# Patient Record
Sex: Female | Born: 1975 | Race: White | Hispanic: No | Marital: Single | Smoking: Never smoker
Health system: Southern US, Community
[De-identification: ages and names within clinical notes are randomized; demographics above are authoritative.]

## PROBLEM LIST (undated history)

## (undated) ENCOUNTER — Emergency Department (HOSPITAL_COMMUNITY): Payer: Self-pay | Source: Home / Self Care

## (undated) ENCOUNTER — Emergency Department: Admission: EM | Source: Home / Self Care

## (undated) DIAGNOSIS — R600 Localized edema: Secondary | ICD-10-CM

## (undated) DIAGNOSIS — F419 Anxiety disorder, unspecified: Secondary | ICD-10-CM

## (undated) DIAGNOSIS — F32A Depression, unspecified: Secondary | ICD-10-CM

## (undated) DIAGNOSIS — M2550 Pain in unspecified joint: Secondary | ICD-10-CM

## (undated) DIAGNOSIS — E039 Hypothyroidism, unspecified: Secondary | ICD-10-CM

## (undated) DIAGNOSIS — D649 Anemia, unspecified: Secondary | ICD-10-CM

## (undated) DIAGNOSIS — M549 Dorsalgia, unspecified: Secondary | ICD-10-CM

## (undated) DIAGNOSIS — R7303 Prediabetes: Secondary | ICD-10-CM

## (undated) DIAGNOSIS — G4730 Sleep apnea, unspecified: Secondary | ICD-10-CM

## (undated) DIAGNOSIS — F209 Schizophrenia, unspecified: Secondary | ICD-10-CM

## (undated) DIAGNOSIS — M722 Plantar fascial fibromatosis: Secondary | ICD-10-CM

## (undated) DIAGNOSIS — F99 Mental disorder, not otherwise specified: Secondary | ICD-10-CM

## (undated) DIAGNOSIS — K5900 Constipation, unspecified: Secondary | ICD-10-CM

## (undated) DIAGNOSIS — E7800 Pure hypercholesterolemia, unspecified: Secondary | ICD-10-CM

## (undated) DIAGNOSIS — E079 Disorder of thyroid, unspecified: Secondary | ICD-10-CM

## (undated) DIAGNOSIS — F909 Attention-deficit hyperactivity disorder, unspecified type: Secondary | ICD-10-CM

## (undated) DIAGNOSIS — I10 Essential (primary) hypertension: Secondary | ICD-10-CM

## (undated) DIAGNOSIS — J45909 Unspecified asthma, uncomplicated: Secondary | ICD-10-CM

## (undated) HISTORY — DX: Essential (primary) hypertension: I10

## (undated) HISTORY — DX: Anxiety disorder, unspecified: F41.9

## (undated) HISTORY — DX: Constipation, unspecified: K59.00

## (undated) HISTORY — DX: Pain in unspecified joint: M25.50

## (undated) HISTORY — DX: Unspecified asthma, uncomplicated: J45.909

## (undated) HISTORY — PX: OTHER SURGICAL HISTORY: SHX169

## (undated) HISTORY — DX: Localized edema: R60.0

## (undated) HISTORY — DX: Schizophrenia, unspecified: F20.9

## (undated) HISTORY — DX: Dorsalgia, unspecified: M54.9

## (undated) HISTORY — DX: Prediabetes: R73.03

## (undated) HISTORY — DX: Mental disorder, not otherwise specified: F99

## (undated) HISTORY — DX: Attention-deficit hyperactivity disorder, unspecified type: F90.9

## (undated) HISTORY — DX: Sleep apnea, unspecified: G47.30

## (undated) HISTORY — PX: ABDOMINAL HYSTERECTOMY: SHX81

## (undated) HISTORY — DX: Hypothyroidism, unspecified: E03.9

## (undated) HISTORY — DX: Plantar fascial fibromatosis: M72.2

## (undated) HISTORY — DX: Pure hypercholesterolemia, unspecified: E78.00

## (undated) HISTORY — DX: Anemia, unspecified: D64.9

## (undated) HISTORY — DX: Depression, unspecified: F32.A

---

## 1998-11-15 ENCOUNTER — Encounter: Payer: Self-pay | Admitting: Endocrinology

## 1998-11-15 ENCOUNTER — Emergency Department (HOSPITAL_COMMUNITY): Admission: EM | Admit: 1998-11-15 | Discharge: 1998-11-16 | Payer: Self-pay | Admitting: Endocrinology

## 1999-12-16 ENCOUNTER — Emergency Department (HOSPITAL_COMMUNITY): Admission: EM | Admit: 1999-12-16 | Discharge: 1999-12-16 | Payer: Self-pay | Admitting: Emergency Medicine

## 2002-03-09 ENCOUNTER — Emergency Department (HOSPITAL_COMMUNITY): Admission: EM | Admit: 2002-03-09 | Discharge: 2002-03-09 | Payer: Self-pay | Admitting: Emergency Medicine

## 2013-10-15 ENCOUNTER — Emergency Department (HOSPITAL_COMMUNITY)
Admission: EM | Admit: 2013-10-15 | Discharge: 2013-10-16 | Disposition: A | Discharge status: Home / Self Care | Attending: Emergency Medicine | Admitting: Emergency Medicine

## 2013-10-15 ENCOUNTER — Encounter (HOSPITAL_COMMUNITY): Payer: Self-pay | Admitting: Emergency Medicine

## 2013-10-15 DIAGNOSIS — Z862 Personal history of diseases of the blood and blood-forming organs and certain disorders involving the immune mechanism: Secondary | ICD-10-CM | POA: Insufficient documentation

## 2013-10-15 DIAGNOSIS — T7421XA Adult sexual abuse, confirmed, initial encounter: Secondary | ICD-10-CM | POA: Insufficient documentation

## 2013-10-15 DIAGNOSIS — S0993XA Unspecified injury of face, initial encounter: Secondary | ICD-10-CM | POA: Diagnosis not present

## 2013-10-15 DIAGNOSIS — S3994XA Unspecified injury of external genitals, initial encounter: Secondary | ICD-10-CM | POA: Diagnosis not present

## 2013-10-15 DIAGNOSIS — S39848A Other specified injuries of external genitals, initial encounter: Secondary | ICD-10-CM | POA: Diagnosis not present

## 2013-10-15 DIAGNOSIS — S199XXA Unspecified injury of neck, initial encounter: Secondary | ICD-10-CM | POA: Diagnosis not present

## 2013-10-15 DIAGNOSIS — Z8639 Personal history of other endocrine, nutritional and metabolic disease: Secondary | ICD-10-CM | POA: Insufficient documentation

## 2013-10-15 DIAGNOSIS — S4980XA Other specified injuries of shoulder and upper arm, unspecified arm, initial encounter: Secondary | ICD-10-CM | POA: Diagnosis not present

## 2013-10-15 DIAGNOSIS — S46909A Unspecified injury of unspecified muscle, fascia and tendon at shoulder and upper arm level, unspecified arm, initial encounter: Secondary | ICD-10-CM | POA: Insufficient documentation

## 2013-10-15 DIAGNOSIS — Z3202 Encounter for pregnancy test, result negative: Secondary | ICD-10-CM | POA: Insufficient documentation

## 2013-10-15 HISTORY — DX: Disorder of thyroid, unspecified: E07.9
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
## 2013-10-16 ENCOUNTER — Emergency Department (HOSPITAL_COMMUNITY)

## 2013-10-16 LAB — URINE MICROSCOPIC-ADD ON

## 2013-10-16 LAB — URINALYSIS, ROUTINE W REFLEX MICROSCOPIC
Bilirubin Urine: NEGATIVE
GLUCOSE, UA: NEGATIVE mg/dL
Hgb urine dipstick: NEGATIVE
Ketones, ur: NEGATIVE mg/dL
Nitrite: NEGATIVE
PH: 7.5 (ref 5.0–8.0)
Protein, ur: NEGATIVE mg/dL
SPECIFIC GRAVITY, URINE: 1.013 (ref 1.005–1.030)
Urobilinogen, UA: 0.2 mg/dL (ref 0.0–1.0)

## 2013-10-16 LAB — POC URINE PREG, ED: PREG TEST UR: NEGATIVE

## 2013-10-16 MED ORDER — PROMETHAZINE HCL 25 MG PO TABS
ORAL_TABLET | ORAL | Status: AC
  Administered 2013-10-16: 75 mg
  Filled 2013-10-16: qty 3

## 2013-10-16 MED ORDER — LEVONORGESTREL 0.75 MG PO TABS
ORAL_TABLET | ORAL | Status: AC
  Filled 2013-10-16: qty 2

## 2013-10-16 MED ORDER — AZITHROMYCIN 1 G PO PACK
PACK | ORAL | Status: AC
  Administered 2013-10-16: 1 g
  Filled 2013-10-16: qty 1

## 2013-10-16 MED ORDER — HYDROCODONE-ACETAMINOPHEN 5-325 MG PO TABS
1.0000 | ORAL_TABLET | Freq: Once | ORAL | Status: AC
  Administered 2013-10-16: 1 via ORAL
  Filled 2013-10-16: qty 1

## 2013-10-16 MED ORDER — CEFIXIME 400 MG PO TABS
ORAL_TABLET | ORAL | Status: AC
  Administered 2013-10-16: 400 mg
  Filled 2013-10-16: qty 1

## 2013-10-16 MED ORDER — METRONIDAZOLE 500 MG PO TABS
ORAL_TABLET | ORAL | Status: AC
  Administered 2013-10-16: 2000 mg
  Filled 2013-10-16: qty 4

## 2013-10-16 NOTE — ED Notes (Signed)
SANE RN here for evaluation

## 2013-10-16 NOTE — ED Provider Notes (Signed)
Medical screening examination/treatment/procedure(s) were performed by non-physician practitioner and as supervising physician I was immediately available for consultation/collaboration.   Sunnie NielsenBrian Victorya Hillman, MD 10/16/13 [phone]
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
## 2013-10-17 ENCOUNTER — Encounter (HOSPITAL_COMMUNITY): Payer: Self-pay | Admitting: Emergency Medicine

## 2013-10-17 ENCOUNTER — Emergency Department (HOSPITAL_COMMUNITY)
Admission: EM | Admit: 2013-10-17 | Discharge: 2013-10-17 | Disposition: A | Discharge status: Home / Self Care | Attending: Emergency Medicine | Admitting: Emergency Medicine

## 2013-10-17 DIAGNOSIS — T148XXA Other injury of unspecified body region, initial encounter: Secondary | ICD-10-CM

## 2013-10-17 DIAGNOSIS — Y93K1 Activity, walking an animal: Secondary | ICD-10-CM | POA: Insufficient documentation

## 2013-10-17 DIAGNOSIS — Z79899 Other long term (current) drug therapy: Secondary | ICD-10-CM | POA: Insufficient documentation

## 2013-10-17 DIAGNOSIS — W540XXA Bitten by dog, initial encounter: Secondary | ICD-10-CM | POA: Insufficient documentation

## 2013-10-17 DIAGNOSIS — Y929 Unspecified place or not applicable: Secondary | ICD-10-CM | POA: Insufficient documentation

## 2013-10-17 DIAGNOSIS — S01511A Laceration without foreign body of lip, initial encounter: Secondary | ICD-10-CM

## 2013-10-17 DIAGNOSIS — S01501A Unspecified open wound of lip, initial encounter: Secondary | ICD-10-CM | POA: Insufficient documentation

## 2013-10-17 DIAGNOSIS — E079 Disorder of thyroid, unspecified: Secondary | ICD-10-CM | POA: Insufficient documentation

## 2013-10-17 MED ORDER — AMOXICILLIN-POT CLAVULANATE 875-125 MG PO TABS
1.0000 | ORAL_TABLET | Freq: Once | ORAL | Status: AC
  Administered 2013-10-17: 1 via ORAL
  Filled 2013-10-17: qty 1

## 2013-10-17 MED ORDER — OXYCODONE-ACETAMINOPHEN 5-325 MG PO TABS
1.0000 | ORAL_TABLET | Freq: Once | ORAL | Status: AC
  Administered 2013-10-17: 1 via ORAL
  Filled 2013-10-17: qty 1

## 2013-10-17 MED ORDER — AMOXICILLIN-POT CLAVULANATE 875-125 MG PO TABS: 1.0000 | ORAL_TABLET | Freq: Two times a day (BID) | ORAL | Status: DC

## 2013-10-17 MED ORDER — HYDROCODONE-ACETAMINOPHEN 5-325 MG PO TABS: 1.0000 | ORAL_TABLET | ORAL | Status: DC | PRN
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
## 2013-10-17 NOTE — ED Notes (Signed)
Bed: WA20 Expected date:  Expected time:  Means of arrival:  Comments: ems-dogbite
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
## 2013-10-17 NOTE — ED Notes (Signed)
Dr. Patria Maneampos at bedside for lac repair.

## 2013-10-17 NOTE — Discharge Instructions (Signed)

## 2013-10-17 NOTE — ED Notes (Signed)
PER EMS - pt from home with c/o dog bite to upper lip and R hand, bleeding controlled.  A+Ox4.
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
## 2013-10-25 ENCOUNTER — Emergency Department (HOSPITAL_COMMUNITY)
Admission: EM | Admit: 2013-10-25 | Discharge: 2013-10-25 | Disposition: A | Discharge status: Home / Self Care | Attending: Emergency Medicine | Admitting: Emergency Medicine

## 2013-10-25 ENCOUNTER — Encounter (HOSPITAL_COMMUNITY): Payer: Self-pay | Admitting: Emergency Medicine

## 2013-10-25 DIAGNOSIS — Z23 Encounter for immunization: Secondary | ICD-10-CM | POA: Insufficient documentation

## 2013-10-25 DIAGNOSIS — Z792 Long term (current) use of antibiotics: Secondary | ICD-10-CM | POA: Insufficient documentation

## 2013-10-25 DIAGNOSIS — Z4802 Encounter for removal of sutures: Secondary | ICD-10-CM | POA: Insufficient documentation

## 2013-10-25 DIAGNOSIS — E079 Disorder of thyroid, unspecified: Secondary | ICD-10-CM | POA: Insufficient documentation

## 2013-10-25 DIAGNOSIS — Z5189 Encounter for other specified aftercare: Secondary | ICD-10-CM

## 2013-10-25 DIAGNOSIS — Z79899 Other long term (current) drug therapy: Secondary | ICD-10-CM | POA: Insufficient documentation

## 2013-10-25 DIAGNOSIS — Z4801 Encounter for change or removal of surgical wound dressing: Secondary | ICD-10-CM | POA: Insufficient documentation

## 2013-10-25 MED ORDER — TETANUS-DIPHTH-ACELL PERTUSSIS 5-2.5-18.5 LF-MCG/0.5 IM SUSP
0.5000 mL | Freq: Once | INTRAMUSCULAR | Status: AC
  Administered 2013-10-25: 0.5 mL via INTRAMUSCULAR
  Filled 2013-10-25: qty 0.5

## 2013-10-25 NOTE — ED Provider Notes (Signed)
Medical screening examination/treatment/procedure(s) were performed by non-physician practitioner and as supervising physician I was immediately available for consultation/collaboration.  Shanna CiscoMegan E Docherty, MD 10/25/13 2056

## 2013-10-25 NOTE — ED Notes (Signed)
Pt was treated for dog bite 5/6. Sutures intact.over r/and center lip. No drainage noted

## 2013-10-25 NOTE — ED Provider Notes (Signed)
CSN: [id number]     Arrival date & time 10/25/13  1238 History  This chart was scribed for non-physician practitioner, Trixie DredgeEmily Kirubel Aja  , working with Shanna CiscoMegan E Docherty, MD, by Tana ConchStephen Methvin ED Scribe. This patient was seen in [facility] and the patient's care was started at 1:31 PM.    Chief Complaint  Patient presents with  . Suture / Staple Removal    sutures to upper lip 8 days after dog bite      The history is provided by the patient and medical records.    HPI Comments: Erika Flores is a 38 y.o. female who presents to the Emergency Department for a f/u on a dog bite to her upper lip, she reports a current pain level of 2/10. She reports that it was her neighbor's vaccinated dog, she was walking the dog when it got startled and bit her. She denies any significant or increased pain, fevers, and discharge from her lip. Pt has only put some hydrogen peroxide on the wound once, nothing else per doctor instructions. Is taking Augmentin, has one does left.    Pt was seen on 10/17/13 for a dog bite to her upper lip, resulting in complex laceration .  Past Medical History  Diagnosis Date  . Thyroid disease    History reviewed. No pertinent past surgical history. No family history on file. History  Substance Use Topics  . Smoking status: Never Smoker   . Smokeless tobacco: Not on file  . Alcohol Use: Yes   OB History   Grav Para Term Preterm Abortions TAB SAB Ect Mult Living                 Review of Systems  Constitutional: Negative for fever and chills.  Skin: Positive for wound (upper lip).  Psychiatric/Behavioral: Negative for confusion and agitation.  All other systems reviewed and are negative.     Allergies  Review of patient's allergies indicates no known allergies.  Home Medications   Prior to Admission medications   Medication Sig Start Date End Date Taking? Authorizing Provider  amoxicillin-clavulanate (AUGMENTIN) 875-125 MG per tablet Take 1 tablet by mouth  every 12 (twelve) hours. 10/17/13   Lyanne CoKevin M Campos, MD  HYDROcodone-acetaminophen (NORCO/VICODIN) 5-325 MG per tablet Take 1 tablet by mouth every 4 (four) hours as needed for moderate pain. 10/17/13   Lyanne CoKevin M Campos, MD  levothyroxine (SYNTHROID, LEVOTHROID) 150 MCG tablet Take 150-300 mcg by mouth See admin instructions. 150 mcg mon-frid  300mcg on sat.sun    Historical Provider, MD   BP 127/88  Pulse 85  SpO2 99%  LMP 09/10/2013 Physical Exam  Nursing note and vitals reviewed. Constitutional: She is oriented to person, place, and time. She appears well-developed and well-nourished. No distress.  HENT:  Head: Normocephalic.    Neck: Normal range of motion. Neck supple.  Pulmonary/Chest: Effort normal.  Musculoskeletal: Normal range of motion.  Neurological: She is alert and oriented to person, place, and time.  Skin: She is not diaphoretic.    ED Course  Procedures (including critical care time)  DIAGNOSTIC STUDIES: Oxygen Saturation is 99% on RA, normal by my interpretation.    COORDINATION OF CARE:   1:37 PM-Discussed treatment plan which includes returning in several days with pt at bedside and pt agreed to plan.    Labs Review Labs Reviewed - No data to display  Imaging Review No results found.   EKG Interpretation None      1:34 PM- Suture  removal note.  Patient presents for suture removal. Only removed one on right lateral edge and left the others in.  Upon taking this suture out it appears the wound may dehisce if the other sutures and scabbing is removed.  Wound care and activity instructions given. Return in 3 days for recheck and possible suture removal.  MDM   Final diagnoses:  Encounter for wound re-check    Pt presents for suture removal and recheck of upper lip wound from dog bite 10/17/13.  Healing well.  Wound not sufficiently healed yet for full suture removal.  One suture removed from far corner of laceration. Appears that it might dehisce if  removed today.  Have asked pt to return in 3 days for recheck.  Discussed wound care at home.  No e/o infection.  D/C home.  Discussed findings, treatment, and follow up  with patient.  Pt given return precautions.  Pt verbalizes understanding and agrees with plan.       I personally performed the services described in this documentation, which was scribed in my presence. The recorded information has been reviewed and is accurate.     Trixie Dredgemily Leonie Amacher, PA-C 10/25/13 1358

## 2013-10-25 NOTE — Discharge Instructions (Signed)
Read the information below.  You may return to the Emergency Department at any time for worsening condition or any new symptoms that concern you.  If you develop redness, swelling, pus draining from the wound, or fevers greater than 100.4, return to the ER immediately for a recheck.     Wound Care Wound care helps prevent pain and infection.  You may need a tetanus shot if:  You cannot remember when you had your last tetanus shot.  You have never had a tetanus shot.  The injury broke your skin. If you need a tetanus shot and you choose not to have one, you may get tetanus. Sickness from tetanus can be serious. HOME CARE   Only take medicine as told by your doctor.  Clean the wound daily with mild soap and water.  Change any bandages (dressings) as told by your doctor.  Put medicated cream and a bandage on the wound as told by your doctor.  Change the bandage if it gets wet, dirty, or starts to smell.  Take showers. Do not take baths, swim, or do anything that puts your wound under water.  Rest and raise (elevate) the wound until the pain and puffiness (swelling) are better.  Keep all doctor visits as told. GET HELP RIGHT AWAY IF:   Yellowish-white fluid (pus) comes from the wound.  Medicine does not lessen your pain.  There is a red streak going away from the wound.  You have a fever. MAKE SURE YOU:   Understand these instructions.  Will watch your condition.  Will get help right away if you are not doing well or get worse. Document Released: 03/09/2008 Document Revised: 08/23/2011 Document Reviewed: 10/04/2010 [facility] Patient Information 2014 [place], [place].
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
## 2017-09-28 DIAGNOSIS — G8929 Other chronic pain: Secondary | ICD-10-CM | POA: Insufficient documentation

## 2017-09-28 DIAGNOSIS — M545 Low back pain, unspecified: Secondary | ICD-10-CM | POA: Insufficient documentation

## 2020-08-26 DIAGNOSIS — E039 Hypothyroidism, unspecified: Secondary | ICD-10-CM | POA: Insufficient documentation

## 2020-08-26 DIAGNOSIS — D509 Iron deficiency anemia, unspecified: Secondary | ICD-10-CM | POA: Insufficient documentation

## 2020-08-26 DIAGNOSIS — K649 Unspecified hemorrhoids: Secondary | ICD-10-CM | POA: Insufficient documentation

## 2020-08-26 DIAGNOSIS — F4329 Adjustment disorder with other symptoms: Secondary | ICD-10-CM | POA: Insufficient documentation

## 2020-08-26 DIAGNOSIS — F3181 Bipolar II disorder: Secondary | ICD-10-CM | POA: Insufficient documentation

## 2020-08-26 DIAGNOSIS — E032 Hypothyroidism due to medicaments and other exogenous substances: Secondary | ICD-10-CM | POA: Insufficient documentation

## 2020-08-26 DIAGNOSIS — T7411XA Adult physical abuse, confirmed, initial encounter: Secondary | ICD-10-CM | POA: Insufficient documentation

## 2020-08-26 DIAGNOSIS — R87612 Low grade squamous intraepithelial lesion on cytologic smear of cervix (LGSIL): Secondary | ICD-10-CM | POA: Insufficient documentation

## 2020-08-26 DIAGNOSIS — F39 Unspecified mood [affective] disorder: Secondary | ICD-10-CM | POA: Insufficient documentation

## 2020-08-26 DIAGNOSIS — N319 Neuromuscular dysfunction of bladder, unspecified: Secondary | ICD-10-CM | POA: Insufficient documentation

## 2020-08-26 DIAGNOSIS — F902 Attention-deficit hyperactivity disorder, combined type: Secondary | ICD-10-CM | POA: Insufficient documentation

## 2020-12-27 ENCOUNTER — Other Ambulatory Visit: Payer: Self-pay

## 2021-01-20 ENCOUNTER — Other Ambulatory Visit: Payer: Self-pay

## 2021-01-20 ENCOUNTER — Ambulatory Visit (INDEPENDENT_AMBULATORY_CARE_PROVIDER_SITE_OTHER): Payer: No Typology Code available for payment source | Admitting: Bariatrics

## 2021-01-20 ENCOUNTER — Encounter (INDEPENDENT_AMBULATORY_CARE_PROVIDER_SITE_OTHER): Payer: Self-pay | Admitting: Bariatrics

## 2021-01-20 VITALS — BP 123/84 | HR 80 | Temp 98.3°F | Ht 65.0 in | Wt 182.0 lb

## 2021-01-20 DIAGNOSIS — R0602 Shortness of breath: Secondary | ICD-10-CM

## 2021-01-20 DIAGNOSIS — R5383 Other fatigue: Secondary | ICD-10-CM | POA: Diagnosis not present

## 2021-01-20 DIAGNOSIS — Z9189 Other specified personal risk factors, not elsewhere classified: Secondary | ICD-10-CM

## 2021-01-20 DIAGNOSIS — Z1331 Encounter for screening for depression: Secondary | ICD-10-CM

## 2021-01-20 DIAGNOSIS — G4733 Obstructive sleep apnea (adult) (pediatric): Secondary | ICD-10-CM

## 2021-01-20 DIAGNOSIS — E538 Deficiency of other specified B group vitamins: Secondary | ICD-10-CM

## 2021-01-20 DIAGNOSIS — I10 Essential (primary) hypertension: Secondary | ICD-10-CM

## 2021-01-20 DIAGNOSIS — R7303 Prediabetes: Secondary | ICD-10-CM

## 2021-01-20 DIAGNOSIS — E669 Obesity, unspecified: Secondary | ICD-10-CM

## 2021-01-20 DIAGNOSIS — E038 Other specified hypothyroidism: Secondary | ICD-10-CM

## 2021-01-20 DIAGNOSIS — E6609 Other obesity due to excess calories: Secondary | ICD-10-CM

## 2021-01-20 DIAGNOSIS — E559 Vitamin D deficiency, unspecified: Secondary | ICD-10-CM

## 2021-01-20 DIAGNOSIS — Z6830 Body mass index (BMI) 30.0-30.9, adult: Secondary | ICD-10-CM

## 2021-01-20 NOTE — Patient Instructions (Signed)
Health Maintenance Due  Topic Date Due   Pneumococcal Vaccine 58-43 Years old (1 - PCV) Never done   HIV Screening  Never done   Hepatitis C Screening  Never done   PAP SMEAR-Modifier  Never done   INFLUENZA VACCINE  01/12/2021    Depression screen PHQ 2/9 01/20/2021  Decreased Interest 3  Down, Depressed, Hopeless 3  PHQ - 2 Score 6  Altered sleeping 2  Tired, decreased energy 2  Change in appetite 2  Feeling bad or failure about yourself  1  Trouble concentrating 1  Moving slowly or fidgety/restless 0  Suicidal thoughts 0  PHQ-9 Score 14  Difficult doing work/chores Somewhat difficult

## 2021-01-21 LAB — LIPID PANEL WITH LDL/HDL RATIO
Cholesterol, Total: 156 mg/dL (ref 100–199)
HDL: 54 mg/dL (ref >39)
LDL Chol Calc (NIH): 86 mg/dL (ref 0–99)
LDL/HDL Ratio: 1.6 ratio (ref 0.0–3.2)
Triglycerides: 82 mg/dL (ref 0–149)
VLDL Cholesterol Cal: 16 mg/dL (ref 5–40)

## 2021-01-21 LAB — VITAMIN D 25 HYDROXY (VIT D DEFICIENCY, FRACTURES): Vit D, 25-Hydroxy: 46.4 ng/mL (ref 30.0–100.0)

## 2021-01-21 LAB — T3, FREE: T3, Free: 2.5 pg/mL (ref 2.0–4.4)

## 2021-01-21 LAB — INSULIN, RANDOM: INSULIN: 20 u[IU]/mL (ref 2.6–24.9)

## 2021-01-21 LAB — TSH: TSH: 1.51 u[IU]/mL (ref 0.450–4.500)

## 2021-01-21 LAB — HEMOGLOBIN A1C
Est. average glucose Bld gHb Est-mCnc: 111 mg/dL
Hgb A1c MFr Bld: 5.5 % (ref 4.8–5.6)

## 2021-01-21 LAB — VITAMIN B12: Vitamin B-12: 504 pg/mL (ref 232–1245)

## 2021-01-21 NOTE — Progress Notes (Signed)
Dear Erika Flores, RD,   Thank you for referring Erika Flores to our clinic. Erika following note includes my evaluation and treatment recommendations.  Chief Complaint:   OBESITY Erika Flores (MR# [id number]) is a 45 y.o. female who presents for evaluation and treatment of obesity and related comorbidities. Current BMI is Body mass index is 30.29 kg/m. Erika Flores has been struggling with her weight for many years and has been unsuccessful in either losing weight, maintaining weight loss, or reaching her healthy weight goal.  Erika Flores is currently in Erika action stage of change and ready to dedicate time achieving and maintaining a healthier weight. Erika Flores is interested in becoming our patient and working on intensive lifestyle modifications including (but not limited to) diet and exercise for weight loss.  Erika Flores likes to Erika Flores.  She states that she is a "picky eater".  She sometimes skips meals.  Her goal is 150 pounds.  Erika Flores's habits were reviewed today and are as follows: Her family eats meals together, her desired weight loss is 80 pounds, she has been heavy most of her life, she started gaining weight in 2010, her heaviest weight ever was 215 pounds, she craves sweets, she wakes up frequently in Erika middle of Erika night to eat, she skips meals frequently, she frequently makes poor food choices, she frequently eats larger portions than normal, she has binge eating behaviors, and she struggles with emotional eating.  Depression Screen Erika Flores's Food and Mood (modified PHQ-9) score was 14.  Depression screen PHQ 2/9 01/20/2021  Decreased Interest 3  Down, Depressed, Hopeless 3  PHQ - 2 Score 6  Altered sleeping 2  Tired, decreased energy 2  Change in appetite 2  Feeling bad or failure about yourself  1  Trouble concentrating 1  Moving slowly or fidgety/restless 0  Suicidal thoughts 0  PHQ-9 Score 14  Difficult doing work/chores Somewhat difficult   Subjective:   1. Other fatigue Erika Flores admits  to daytime somnolence and reports waking up still tired. Erika Flores has a history of symptoms of daytime fatigue and morning fatigue. Erika Flores generally gets 4 hours of sleep per night, and states that she has poor quality sleep. Snoring is not present. Apneic episodes are present. Epworth Sleepiness Score is 6.  2. SOB (shortness of breath) on exertion Erika Flores notes increasing shortness of breath with exercising and seems to be worsening over time with weight gain. She notes getting out of breath sooner with activity than she used to. This has gotten worse recently. Erika Flores denies shortness of breath at rest or orthopnea.  3. Essential hypertension Review: taking medications as instructed, no medication side effects noted, no chest pain on exertion, no dyspnea on exertion, no swelling of ankles.  Controlled.  She is on no medications.  BP Readings from Last 3 Encounters:  01/20/21 123/84  10/25/13 127/88  10/17/13 132/85   4. OSA (obstructive sleep apnea) Erika Flores has a diagnosis of sleep apnea. She reports that she is not using a CPAP regularly.  She is claustrophobic.   5. Other specified hypothyroidism She is taking Synthroid.  Lab Results  Component Value Date   TSH 1.510 01/20/2021   6. Prediabetes Erika Flores has a diagnosis of prediabetes based on her elevated HgA1c and was informed this puts her at greater risk of developing diabetes. She continues to work on diet and exercise to decrease her risk of diabetes. She denies nausea or hypoglycemia.  Diagnosed 1 year ago.  Positive family history of diabetes.  Lab Results  Component Value Date   HGBA1C 5.5 01/20/2021   Lab Results  Component Value Date   INSULIN WILL FOLLOW 01/20/2021   7. Vitamin D deficiency She is currently taking no vitamin D supplement.   Lab Results  Component Value Date   VD25OH 46.4 01/20/2021   8. Vitamin B 12 deficiency She is not taking a vitamin B12 supplement.  Lab Results  Component Value Date   VITAMINB12 504  01/20/2021   9. Screening for depression Erika Flores was screened for depression as part of her new patient workup today.  PHQ-9 is 14.  10. At risk for activity intolerance Erika Flores is at risk for activity intolerance due to obesity and joint and back pain.  Assessment/Plan:   1. Other fatigue Erika Flores does feel that her weight is causing her energy to be lower than it should be. Fatigue may be related to obesity, depression or many other causes. Labs will be ordered, and in Erika meanwhile, Erika Flores will focus on self care including making healthy food choices, increasing physical activity and focusing on stress reduction.  - TSH - EKG 12-Lead - Comprehensive metabolic panel  2. SOB (shortness of breath) on exertion Erika Flores does feel that she gets out of breath more easily that she used to when she exercises. Erika Flores's shortness of breath appears to be obesity related and exercise induced. She has agreed to work on weight loss and gradually increase exercise to treat her exercise induced shortness of breath. Will continue to monitor closely.  3. Essential hypertension Erika Flores is working on healthy weight loss and exercise to improve blood pressure control. We will watch for signs of hypotension as she continues her lifestyle modifications.  No added salt in diet.  Increase exercise.  Check CMP and lipid panel.  - Lipid Panel With LDL/HDL Ratio  4. OSA (obstructive sleep apnea) Intensive lifestyle modifications are Erika first line treatment for this issue. We discussed several lifestyle modifications today and she will continue to work on diet, exercise and weight loss efforts. We will continue to monitor. Orders and follow up as documented in patient record.  We discussed Erika importance of restful sleep.  5. Other specified hypothyroidism Patient with long-standing hypothyroidism, on levothyroxine therapy. She appears euthyroid. Orders and follow up as documented in patient record.  Will check thyroid panel.   Continue Synthroid.  Counseling Good thyroid control is important for overall health. Supratherapeutic thyroid levels are dangerous and will not improve weight loss results. Erika correct way to take levothyroxine is fasting, with water, separated by at least 30 minutes from breakfast, and separated by more than 4 hours from calcium, iron, multivitamins, acid reflux medications (PPIs).   - T3, free - T4, free - T3, free  6. Prediabetes Lamaria will continue to work on weight loss, exercise, and decreasing simple carbohydrates to help decrease Erika risk of diabetes.  Check A1c and insulin level.  - Hemoglobin A1c - Insulin, random  7. Vitamin D deficiency Low Vitamin D level contributes to fatigue and are associated with obesity, breast, and colon cancer. Check vitamin D level today.  - VITAMIN D 25 Hydroxy (Vit-D Deficiency, Fractures)  8. Vitamin B 12 deficiency Erika diagnosis was reviewed with Erika patient. Counseling provided today, see below. We will continue to monitor. Orders and follow up as documented in patient record.  Check vitamin B12 level today.  Counseling Erika body needs vitamin B12: to make red blood cells; to make DNA; and to help Erika nerves work  properly so they can carry messages from Erika brain to Erika body.  Erika main causes of vitamin B12 deficiency include dietary deficiency, digestive diseases, pernicious anemia, and having a surgery in which part of Erika stomach or small intestine is removed.  Certain medicines can make it harder for Erika body to absorb vitamin B12. These medicines include: heartburn medications; some antibiotics; some medications used to treat diabetes, gout, and high cholesterol.  In some cases, there are no symptoms of this condition. If Erika condition leads to anemia or nerve damage, various symptoms can occur, such as weakness or fatigue, shortness of breath, and numbness or tingling in your hands and feet.   Treatment:  May include taking vitamin B12  supplements.  Avoid alcohol.  Eat lots of healthy foods that contain vitamin B12: Beef, pork, chicken, [place], and organ meats, such as liver.  Seafood: This includes clams, rainbow trout, salmon, tuna, and haddock. Eggs.  Cereal and dairy products that are fortified: This means that vitamin B12 has been added to Erika food.   - Vitamin B12  9. Screening for depression Erika Flores had a positive depression screening. Depression is commonly associated with obesity and often results in emotional eating behaviors. We will monitor this closely and work on CBT to help improve Erika non-hunger eating patterns. Referral to Psychology may be required if no improvement is seen as she continues in our clinic.  10. At risk for activity intolerance Erika Flores was given approximately 15 minutes of exercise intolerance counseling today. She is 45 y.o. female and has risk factors exercise intolerance including obesity. We discussed intensive lifestyle modifications today with an emphasis on specific weight loss instructions and strategies. Erika Flores will slowly increase activity as tolerated.  Repetitive spaced learning was employed today to elicit superior memory formation and behavioral change.   11. Class 1 obesity with serious comorbidity and body mass index (BMI) of 30.0 to 30.9 in adult, unspecified obesity type  Erika Flores is currently in Erika action stage of change and her goal is to continue with weight loss efforts. I recommend Erika Flores begin Erika structured treatment plan as follows:  She has agreed to Erika Category 2 Plan and keeping a food journal and adhering to recommended goals of 1200 calories and 80+ grams of protein.  She will work on meal planning, intention eating, will decrease sweets in her diet, and will decrease portion sizes.  Exercise goals:  Will start walking.    Behavioral modification strategies: increasing lean protein intake, decreasing simple carbohydrates, increasing vegetables, increasing water intake,  decreasing eating out, no skipping meals, meal planning and cooking strategies, keeping healthy foods in Erika home, and planning for success.  She was informed of Erika importance of frequent follow-up visits to maximize her success with intensive lifestyle modifications for her multiple health conditions. She was informed we would discuss her lab results at her next visit unless there is a critical issue that needs to be addressed sooner. Erika Flores agreed to keep her next visit at Erika agreed upon time to discuss these results.  Objective:   Blood pressure 123/84, pulse 80, temperature 98.3 F (36.8 C), height 5\' 5"  (1.651 m), weight 182 lb (82.6 kg), SpO2 99 %. Body mass index is 30.29 kg/m.  EKG: Normal sinus rhythm, rate 75 bpm.  Indirect Calorimeter completed today shows a VO2 of 248 and a REE of 1714.  Her calculated basal metabolic rate is [id number] thus her basal metabolic rate is better than expected.  General: Cooperative, alert, well  developed, in no acute distress. HEENT: Conjunctivae and lids unremarkable. Cardiovascular: Regular rhythm.  Lungs: Normal work of breathing. Neurologic: No focal deficits.   Lab Results  Component Value Date   HGBA1C 5.5 01/20/2021   Lab Results  Component Value Date   INSULIN WILL FOLLOW 01/20/2021   Lab Results  Component Value Date   TSH 1.510 01/20/2021   Lab Results  Component Value Date   CHOL 156 01/20/2021   HDL 54 01/20/2021   LDLCALC 86 01/20/2021   TRIG 82 01/20/2021   Attestation Statements:   Reviewed by clinician on day of visit: allergies, medications, problem list, medical history, surgical history, family history, social history, and previous encounter notes.  I, Insurance claims handler, CMA, am acting as Energy manager for [employer], DO  I have reviewed Erika above documentation for accuracy and completeness, and I agree with Erika above. Corinna Capra, DO

## 2021-01-22 ENCOUNTER — Encounter (INDEPENDENT_AMBULATORY_CARE_PROVIDER_SITE_OTHER): Payer: Self-pay | Admitting: Bariatrics

## 2021-01-22 DIAGNOSIS — R338 Other retention of urine: Secondary | ICD-10-CM | POA: Insufficient documentation

## 2021-01-22 DIAGNOSIS — K5904 Chronic idiopathic constipation: Secondary | ICD-10-CM | POA: Insufficient documentation

## 2021-01-22 DIAGNOSIS — J329 Chronic sinusitis, unspecified: Secondary | ICD-10-CM | POA: Insufficient documentation

## 2021-01-22 DIAGNOSIS — G4489 Other headache syndrome: Secondary | ICD-10-CM | POA: Insufficient documentation

## 2021-01-22 DIAGNOSIS — R945 Abnormal results of liver function studies: Secondary | ICD-10-CM | POA: Insufficient documentation

## 2021-01-22 DIAGNOSIS — R7989 Other specified abnormal findings of blood chemistry: Secondary | ICD-10-CM | POA: Insufficient documentation

## 2021-01-22 DIAGNOSIS — G4730 Sleep apnea, unspecified: Secondary | ICD-10-CM | POA: Insufficient documentation

## 2021-01-22 DIAGNOSIS — F5081 Binge eating disorder: Secondary | ICD-10-CM | POA: Insufficient documentation

## 2021-01-22 DIAGNOSIS — E890 Postprocedural hypothyroidism: Secondary | ICD-10-CM | POA: Insufficient documentation

## 2021-01-22 DIAGNOSIS — K219 Gastro-esophageal reflux disease without esophagitis: Secondary | ICD-10-CM | POA: Insufficient documentation

## 2021-01-22 DIAGNOSIS — H5210 Myopia, unspecified eye: Secondary | ICD-10-CM | POA: Insufficient documentation

## 2021-01-22 DIAGNOSIS — L508 Other urticaria: Secondary | ICD-10-CM | POA: Insufficient documentation

## 2021-02-03 ENCOUNTER — Other Ambulatory Visit: Payer: Self-pay

## 2021-02-03 ENCOUNTER — Ambulatory Visit (INDEPENDENT_AMBULATORY_CARE_PROVIDER_SITE_OTHER): Payer: No Typology Code available for payment source | Admitting: Bariatrics

## 2021-02-03 ENCOUNTER — Encounter (INDEPENDENT_AMBULATORY_CARE_PROVIDER_SITE_OTHER): Payer: Self-pay | Admitting: Bariatrics

## 2021-02-03 VITALS — BP 144/97 | HR 89 | Temp 98.3°F | Ht 65.0 in | Wt 180.0 lb

## 2021-02-03 DIAGNOSIS — R632 Polyphagia: Secondary | ICD-10-CM

## 2021-02-03 DIAGNOSIS — E038 Other specified hypothyroidism: Secondary | ICD-10-CM

## 2021-02-03 DIAGNOSIS — Z6830 Body mass index (BMI) 30.0-30.9, adult: Secondary | ICD-10-CM

## 2021-02-03 DIAGNOSIS — E8881 Metabolic syndrome: Secondary | ICD-10-CM

## 2021-02-03 DIAGNOSIS — R030 Elevated blood-pressure reading, without diagnosis of hypertension: Secondary | ICD-10-CM | POA: Diagnosis not present

## 2021-02-03 DIAGNOSIS — E669 Obesity, unspecified: Secondary | ICD-10-CM

## 2021-02-03 DIAGNOSIS — F5089 Other specified eating disorder: Secondary | ICD-10-CM

## 2021-02-03 MED ORDER — BUPROPION HCL ER (SR) 150 MG PO TB12: 150.0000 mg | ORAL_TABLET | Freq: Every day | ORAL | 0 refills | Status: DC

## 2021-02-04 NOTE — Progress Notes (Signed)
Chief Complaint:   OBESITY Erika Flores is here to discuss her progress with her obesity treatment plan along with follow-up of her obesity related diagnoses. Erika Flores is on the Category 2 Plan and states she is following her eating plan approximately 45% of the time. Erika Flores states she is doing 0 minutes 0 times per week.  Today's visit was #: 2 Starting weight: 182 lbs Starting date: 01/20/2021 Today's weight: 180 lbs Today's date: 02/03/2021 Total lbs lost to date: 2 lbs Total lbs lost since last in-office visit: 2 lbs  Interim History: Erika Flores is down 2 lbs since her last visit. She is doing a protein shake for breakfast. "I am a picky eater".  Subjective:   1. Insulin resistance Erika Flores is currently not on medications. Her insulin level = 200. Her A1C = 5.5.   2. Other specified hypothyroidism Erika Flores is currently taking Synthroid.  3. Elevated blood pressure reading Erika Flores is currently not on medication. She has no history of medicines. She notes pain in her leg. She had a scan at the ER no definite diagnosis.  4. Polyphagia Erika Flores has clingy sugars. She is eating more at night. She states she is hungry throughout the day.  5. Other disorder of eating Erika Flores notes emotional eating.  Assessment/Plan:   1. Insulin resistance Erika Flores will continue to work on weight loss, exercise, and decreasing simple carbohydrates to help decrease the risk of diabetes. She will increase healthy fats and protein. Erika Flores agreed to follow-up with [place] as directed to closely monitor her progress. Handout: on Insulin resistance was given today.  2. Other specified hypothyroidism Erika Flores will continue Synthroid. Orders and follow up as documented in patient record.  Counseling Good thyroid control is important for overall health. Erika Flores thyroid levels are dangerous and will not improve weight loss results. Counseling: The correct way to take levothyroxine is fasting, with water, separated by at least 30 minutes from  breakfast, and separated by more than 4 hours from calcium, iron, multivitamins, acid reflux medications (PPIs).    3. Elevated blood pressure reading Erika Flores will continue to monitor her blood pressure reading, and follow-up soon with her physician at the [place].   4. Polyphagia Intensive lifestyle modifications are the first line treatment for this issue. We discussed several lifestyle modifications today and she will continue to work on diet, exercise and weight loss efforts. Orders and follow up as documented in patient record.  Counseling Polyphagia is excessive hunger. Causes can include: low blood sugars, hypERthyroidism, PMS, lack of sleep, stress, insulin resistance, diabetes, certain medications, and diets that are deficient in protein and fiber.    5. Other disorder of eating Behavior modification techniques were discussed today to help Erika Flores deal with her emotional/non-hunger eating behaviors.  We will refill Wellbutrin 150 mg 1 tablet in the morning for1 month with no refills. Orders and follow up as documented in patient record.    - buPROPion (WELLBUTRIN SR) 150 MG 12 hr tablet; Take 1 tablet (150 mg total) by mouth daily.  Dispense: 30 tablet; Refill: 0  6. Obesity, current BMI 30 Erika Flores is currently in the action stage of change. As such, her goal is to continue with weight loss efforts. She has agreed to the Category 2 Plan.   Erika Flores will continue meal planning. We reviewed labs from 01/20/2021 today. She will weight meat and will get snacks in.  Exercise goals: No exercise has been prescribed at this time.  Behavioral modification strategies: increasing lean protein intake, decreasing  simple carbohydrates, increasing vegetables, increasing water intake, decreasing eating out, no skipping meals, meal planning and cooking strategies, keeping healthy foods in the home, and planning for success.  Erika Flores has agreed to follow-up with our clinic in 2 weeks. She was informed of the importance of  frequent follow-up visits to maximize her success with intensive lifestyle modifications for her multiple health conditions.   Objective:   Blood pressure (!) 144/97, pulse 89, temperature 98.3 F (36.8 C), height 5\' 5"  (1.651 m), weight 180 lb (81.6 kg), SpO2 99 %. Body mass index is 29.95 kg/m.  General: Cooperative, alert, well developed, in no acute distress. HEENT: Conjunctivae and lids unremarkable. Cardiovascular: Regular rhythm.  Lungs: Normal work of breathing. Neurologic: No focal deficits.   No results found for: CREATININE, BUN, NA, K, CL, CO2 No results found for: ALT, AST, GGT, ALKPHOS, BILITOT Lab Results  Component Value Date   HGBA1C 5.5 01/20/2021   Lab Results  Component Value Date   INSULIN 20.0 01/20/2021   Lab Results  Component Value Date   TSH 1.510 01/20/2021   Lab Results  Component Value Date   CHOL 156 01/20/2021   HDL 54 01/20/2021   LDLCALC 86 01/20/2021   TRIG 82 01/20/2021   Lab Results  Component Value Date   VD25OH 46.4 01/20/2021   No results found for: WBC, HGB, HCT, MCV, PLT No results found for: IRON, TIBC, FERRITIN  Attestation Statements:   Reviewed by clinician on day of visit: allergies, medications, problem list, medical history, surgical history, family history, social history, and previous encounter notes.  I, 03/22/2021, RMA, am acting as [place] for Energy manager, DO.   I have reviewed the above documentation for accuracy and completeness, and I agree with the above. [employer], DO

## 2021-02-05 ENCOUNTER — Encounter (INDEPENDENT_AMBULATORY_CARE_PROVIDER_SITE_OTHER): Payer: Self-pay | Admitting: Bariatrics

## 2021-02-17 ENCOUNTER — Telehealth (INDEPENDENT_AMBULATORY_CARE_PROVIDER_SITE_OTHER): Payer: Self-pay

## 2021-02-17 ENCOUNTER — Ambulatory Visit (INDEPENDENT_AMBULATORY_CARE_PROVIDER_SITE_OTHER): Payer: No Typology Code available for payment source | Admitting: Family Medicine

## 2021-02-17 ENCOUNTER — Other Ambulatory Visit: Payer: Self-pay

## 2021-02-17 ENCOUNTER — Encounter (INDEPENDENT_AMBULATORY_CARE_PROVIDER_SITE_OTHER): Payer: Self-pay | Admitting: Family Medicine

## 2021-02-17 DIAGNOSIS — E669 Obesity, unspecified: Secondary | ICD-10-CM

## 2021-02-17 DIAGNOSIS — R7303 Prediabetes: Secondary | ICD-10-CM | POA: Diagnosis not present

## 2021-02-17 DIAGNOSIS — F5089 Other specified eating disorder: Secondary | ICD-10-CM

## 2021-02-17 DIAGNOSIS — Z6830 Body mass index (BMI) 30.0-30.9, adult: Secondary | ICD-10-CM

## 2021-02-17 MED ORDER — BUPROPION HCL ER (SR) 150 MG PO TB12: 150.0000 mg | ORAL_TABLET | Freq: Every day | ORAL | 0 refills | Status: DC

## 2021-02-17 MED ORDER — TRULICITY 0.75 MG/0.5ML ~~LOC~~ SOAJ: 0.7500 mg | SUBCUTANEOUS | 0 refills | Status: DC

## 2021-02-17 NOTE — Telephone Encounter (Signed)
Prior authorizations has been started for Tr[employer]. Will notify patient and provider once response is received.

## 2021-02-17 NOTE — Progress Notes (Signed)
Chief Complaint:   OBESITY Jermaine is here to discuss her progress with her obesity treatment plan along with follow-up of her obesity related diagnoses. Harlee is on the Category 2 Plan and states she is following her eating plan approximately 50% of the time. Ajiah states she is walking for 60 minutes 2 times per week.  Today's visit was #: 3 Starting weight: 182 lbs Starting date: 01/20/2021 Today's weight: 184 lbs Today's date: 02/17/2021 Total lbs lost to date: 0 Total lbs lost since last in-office visit: 0  Interim History: Hiya is skipping breakfast. She is on plan at lunch but does not feel she gets full from frozen meal. She struggles with snacking between meals and at night. She on  plan at dinner also. She notes they tend to have snacks in the home that are not healthy.  Subjective:   1. Prediabetes Lilian's last A1C was 5.5. She notes hunger throughout the day.   Lab Results  Component Value Date   HGBA1C 5.5 01/20/2021   Lab Results  Component Value Date   INSULIN 20.0 01/20/2021   2. Other disorder of eating/emotional eating Traniyah sees a counselor at the [place] twice monthly and psychiatrist every 2 months. She is unsure if bupropion is helping with stress eating.  She denies side effects from bupropion. She notes emotional eating.  Assessment/Plan:   1. Prediabetes Sheriece agrees to start Trulicity 0.75 mg weekly. She will continue to work on weight loss, exercise, and decreasing simple carbohydrates to help decrease the risk of diabetes.   - Dulaglutide (TRULICITY) 0.75 MG/0.5ML SOPN; Inject 0.75 mg into the skin once a week.  Dispense: 2 mL; Refill: 0  2. Other disorder of eating/emotional eating Behavior modification techniques were discussed today to help Zeyna deal with her emotional/non-hunger eating behaviors.  We will refill bupropion 150 mg by mouth daily for 1 month with no refills. Orders and follow up as documented in patient record.    - buPROPion (WELLBUTRIN  SR) 150 MG 12 hr tablet; Take 1 tablet (150 mg total) by mouth daily.  Dispense: 30 tablet; Refill: 0  3. Obesity, current BMI 30.62 Sully is currently in the action stage of change. As such, her goal is to continue with weight loss efforts. She has agreed to the Category 2 Plan.   - buPROPion (WELLBUTRIN SR) 150 MG 12 hr tablet; Take 1 tablet (150 mg total) by mouth daily.  Dispense: 30 tablet; Refill: 0  Exercise goals:  As is.  Behavioral modification strategies: increasing lean protein intake, no skipping meals, and dealing with family or coworker sabotage.  Leslea has agreed to follow-up with our clinic in 2-3 weeks.  Objective:   Blood pressure 134/83, pulse 78, temperature 97.9 F (36.6 C), height 5\' 5"  (1.651 m), weight 184 lb (83.5 kg), SpO2 98 %. Body mass index is 30.62 kg/m.  General: Cooperative, alert, well developed, in no acute distress. HEENT: Conjunctivae and lids unremarkable. Cardiovascular: Regular rhythm.  Lungs: Normal work of breathing. Neurologic: No focal deficits.   No results found for: CREATININE, BUN, NA, K, CL, CO2 No results found for: ALT, AST, GGT, ALKPHOS, BILITOT Lab Results  Component Value Date   HGBA1C 5.5 01/20/2021   Lab Results  Component Value Date   INSULIN 20.0 01/20/2021   Lab Results  Component Value Date   TSH 1.510 01/20/2021   Lab Results  Component Value Date   CHOL 156 01/20/2021   HDL 54 01/20/2021  LDLCALC 86 01/20/2021   TRIG 82 01/20/2021   Lab Results  Component Value Date   VD25OH 46.4 01/20/2021   No results found for: WBC, HGB, HCT, MCV, PLT No results found for: IRON, TIBC, FERRITIN  Obesity Behavioral Intervention:   Approximately 15 minutes were spent on the discussion below.  ASK: We discussed the diagnosis of obesity with Misty Stanley today and Christopher agreed to give [place] permission to discuss obesity behavioral modification therapy today.  ASSESS: [place] has the diagnosis of obesity and her BMI today is  30.7. Kateleen is in the action stage of change.   ADVISE: Falen was educated on the multiple health risks of obesity as well as the benefit of weight loss to improve her health. She was advised of the need for long term treatment and the importance of lifestyle modifications to improve her current health and to decrease her risk of future health problems.  AGREE: Multiple dietary modification options and treatment options were discussed and Shadonna agreed to follow the recommendations documented in the above note.  ARRANGE: Mauriana was educated on the importance of frequent visits to treat obesity as outlined per CMS and USPSTF guidelines and agreed to schedule her next follow up appointment today.  Attestation Statements:   Reviewed by clinician on day of visit: allergies, medications, problem list, medical history, surgical history, family history, social history, and previous encounter notes.   I, Jackson Latino, RMA, am acting as Energy manager for [place], FNP.   I have reviewed the above documentation for accuracy and completeness, and I agree with the above. -  Jesse Sans, FNP

## 2021-02-17 NOTE — Telephone Encounter (Signed)
Pt had a in office visit today.  Dawn has rx'd Trulicity today, as she thinks pt may need a PA for this.  Please advise.

## 2021-02-19 DIAGNOSIS — E669 Obesity, unspecified: Secondary | ICD-10-CM | POA: Insufficient documentation

## 2021-02-19 DIAGNOSIS — Z6830 Body mass index (BMI) 30.0-30.9, adult: Secondary | ICD-10-CM | POA: Insufficient documentation

## 2021-02-19 DIAGNOSIS — R7303 Prediabetes: Secondary | ICD-10-CM | POA: Insufficient documentation

## 2021-02-23 ENCOUNTER — Telehealth (INDEPENDENT_AMBULATORY_CARE_PROVIDER_SITE_OTHER): Payer: Self-pay | Admitting: Family Medicine

## 2021-02-23 ENCOUNTER — Encounter (INDEPENDENT_AMBULATORY_CARE_PROVIDER_SITE_OTHER): Payer: Self-pay | Admitting: Family Medicine

## 2021-02-23 DIAGNOSIS — R7303 Prediabetes: Secondary | ICD-10-CM

## 2021-02-23 NOTE — Telephone Encounter (Addendum)
Prior authorization was sent on 02/17/21 through [facility] for Trulicity. Patient stated in message the doctor sent a form to be completed. I have not seen or received a form.

## 2021-02-23 NOTE — Telephone Encounter (Signed)
Pt last seen by Dawn Whitmire, FNP.  

## 2021-02-24 ENCOUNTER — Other Ambulatory Visit (INDEPENDENT_AMBULATORY_CARE_PROVIDER_SITE_OTHER): Payer: Self-pay | Admitting: Family Medicine

## 2021-02-24 DIAGNOSIS — R7303 Prediabetes: Secondary | ICD-10-CM

## 2021-02-24 MED ORDER — OZEMPIC (0.25 OR 0.5 MG/DOSE) 2 MG/1.5ML ~~LOC~~ SOPN: 0.2500 mg | PEN_INJECTOR | SUBCUTANEOUS | 0 refills | Status: DC

## 2021-02-24 NOTE — Telephone Encounter (Signed)
There is a new message thread started from the patient.  Please see other message.

## 2021-02-24 NOTE — Telephone Encounter (Signed)
Fax from [place]:  Trulicity not covered Alternatives included, per the pharmacy:  Ozempic Please change to [employer]

## 2021-03-10 ENCOUNTER — Encounter (INDEPENDENT_AMBULATORY_CARE_PROVIDER_SITE_OTHER): Payer: Self-pay | Admitting: Family Medicine

## 2021-03-10 ENCOUNTER — Ambulatory Visit (INDEPENDENT_AMBULATORY_CARE_PROVIDER_SITE_OTHER): Payer: No Typology Code available for payment source | Admitting: Family Medicine

## 2021-03-10 ENCOUNTER — Other Ambulatory Visit: Payer: Self-pay

## 2021-03-10 VITALS — BP 124/90 | HR 89 | Temp 97.9°F | Ht 65.0 in | Wt 184.0 lb

## 2021-03-10 DIAGNOSIS — E669 Obesity, unspecified: Secondary | ICD-10-CM | POA: Diagnosis not present

## 2021-03-10 DIAGNOSIS — F5081 Binge eating disorder: Secondary | ICD-10-CM

## 2021-03-10 DIAGNOSIS — R7303 Prediabetes: Secondary | ICD-10-CM

## 2021-03-10 DIAGNOSIS — F5089 Other specified eating disorder: Secondary | ICD-10-CM | POA: Diagnosis not present

## 2021-03-10 DIAGNOSIS — Z6830 Body mass index (BMI) 30.0-30.9, adult: Secondary | ICD-10-CM

## 2021-03-10 MED ORDER — BUPROPION HCL ER (SR) 150 MG PO TB12: 150.0000 mg | ORAL_TABLET | Freq: Every day | ORAL | 0 refills | Status: DC

## 2021-03-10 MED ORDER — OZEMPIC (0.25 OR 0.5 MG/DOSE) 2 MG/1.5ML ~~LOC~~ SOPN: 0.2500 mg | PEN_INJECTOR | SUBCUTANEOUS | 0 refills | Status: DC

## 2021-03-10 NOTE — Progress Notes (Signed)
Chief Complaint:   OBESITY Annalaura is here to discuss her progress with her obesity treatment plan along with follow-up of her obesity related diagnoses. Sheela is on the Category 2 Plan and states she is following her eating plan approximately 60% of the time. Tanishi states she is walking for 45 minutes 3 times per week.  Today's visit was #: 4 Starting weight: 182 lbs Starting date: 01/20/2021 Today's weight: 184 lbs Today's date: 03/10/2021 Total lbs lost to date: 0  Total lbs lost since last in-office visit: maintained  Interim History: Kynesha feels she is doing better with adhering to the plan. She has a protein bar or shake for breakfast. She has a frozen meal at lunch. She has vegetables and meat with supper. She has peanut butter for a snack.   Subjective:   1. Prediabetes Keyira has her first Ozempic shot today. She struggles with polyphagia throughout the day and sometimes gets up at night to eat.   Lab Results  Component Value Date   HGBA1C 5.5 01/20/2021   Lab Results  Component Value Date   INSULIN 20.0 01/20/2021    2. Binge eating disorder Lias struggles with this 2-3 days per week. This is triggered by stress. She talks with counselor at [facility] about this and sees him monthly. She is on Vyvanse. She has been binge eating for several years.   3. Other disorder of eating/emotional eating Natacia was started on Bupropion at last office visit. She felt it helped with emotional eating at first but now she is unsure.  Assessment/Plan:   1. Prediabetes  Carliss may increase Ozempic to 0.5 mg if needed.  - Semaglutide,0.25 or 0.5MG /DOS, (OZEMPIC, 0.25 OR 0.5 MG/DOSE,) 2 MG/1.5ML SOPN; Inject 0.25 mg into the skin once a week.  Dispense: 1.5 mL; Refill: 0  2. Binge eating disorder Aleysha will continue Vyvanse and continue to follow up with counselor at [facility].  3. Other disorder of eating/emotional eating   We will refill bupropion 150 mg for 1 month with no refills. Orders and follow  up as documented in patient record.    - buPROPion (WELLBUTRIN SR) 150 MG 12 hr tablet; Take 1 tablet (150 mg total) by mouth daily.  Dispense: 30 tablet; Refill: 0  4. Obesity, current BMI 30.62 Levy is currently in the action stage of change. As such, her goal is to continue with weight loss efforts. She has agreed to the Category 2 Plan.   Illene was advised she may have a side salad at lunch with 10 calorie dressing rather than a piece of fruit.Marland Kitchen  Exercise goals:  As is.  Behavioral modification strategies: decreasing simple carbohydrates, decreasing liquid calories, and dealing with family or coworker sabotage.  Madgie has agreed to follow-up with our clinic in 2-3 weeks.  Objective:   Blood pressure 124/90, pulse 89, temperature 97.9 F (36.6 C), height 5\' 5"  (1.651 m), weight 184 lb (83.5 kg), SpO2 98 %. Body mass index is 30.62 kg/m.  General: Cooperative, alert, well developed, in no acute distress. HEENT: Conjunctivae and lids unremarkable. Cardiovascular: Regular rhythm.  Lungs: Normal work of breathing. Neurologic: No focal deficits.   No results found for: CREATININE, BUN, NA, K, CL, CO2 No results found for: ALT, AST, GGT, ALKPHOS, BILITOT Lab Results  Component Value Date   HGBA1C 5.5 01/20/2021   Lab Results  Component Value Date   INSULIN 20.0 01/20/2021   Lab Results  Component Value Date   TSH 1.510 01/20/2021  Lab Results  Component Value Date   CHOL 156 01/20/2021   HDL 54 01/20/2021   LDLCALC 86 01/20/2021   TRIG 82 01/20/2021   Lab Results  Component Value Date   VD25OH 46.4 01/20/2021   No results found for: WBC, HGB, HCT, MCV, PLT No results found for: IRON, TIBC, FERRITIN  Attestation Statements:   Reviewed by clinician on day of visit: allergies, medications, problem list, medical history, surgical history, family history, social history, and previous encounter notes.   I, Jackson Latino, RMA, am acting as Energy manager for [employer], FNP.   I have reviewed the above documentation for accuracy and completeness, and I agree with the above. -  Jesse Sans, FNP

## 2021-03-25 ENCOUNTER — Ambulatory Visit (INDEPENDENT_AMBULATORY_CARE_PROVIDER_SITE_OTHER): Payer: No Typology Code available for payment source | Admitting: Family Medicine

## 2021-03-25 ENCOUNTER — Other Ambulatory Visit: Payer: Self-pay

## 2021-03-25 ENCOUNTER — Encounter (INDEPENDENT_AMBULATORY_CARE_PROVIDER_SITE_OTHER): Payer: Self-pay | Admitting: Family Medicine

## 2021-03-25 VITALS — BP 131/91 | HR 94 | Temp 98.9°F | Ht 65.0 in | Wt 183.0 lb

## 2021-03-25 DIAGNOSIS — E669 Obesity, unspecified: Secondary | ICD-10-CM

## 2021-03-25 DIAGNOSIS — R7303 Prediabetes: Secondary | ICD-10-CM

## 2021-03-25 DIAGNOSIS — Z6830 Body mass index (BMI) 30.0-30.9, adult: Secondary | ICD-10-CM | POA: Diagnosis not present

## 2021-03-25 DIAGNOSIS — F5089 Other specified eating disorder: Secondary | ICD-10-CM

## 2021-03-25 MED ORDER — OZEMPIC (0.25 OR 0.5 MG/DOSE) 2 MG/1.5ML ~~LOC~~ SOPN: 0.2500 mg | PEN_INJECTOR | SUBCUTANEOUS | 0 refills | Status: DC

## 2021-03-25 MED ORDER — BUPROPION HCL ER (SR) 150 MG PO TB12: 150.0000 mg | ORAL_TABLET | Freq: Every day | ORAL | 0 refills | Status: DC

## 2021-03-25 NOTE — Progress Notes (Signed)
Chief Complaint:   OBESITY Erika Flores is here to discuss her progress with her obesity treatment plan along with follow-up of her obesity related diagnoses. Erika Flores is on the Category 2 Plan and states she is following her eating plan approximately 65% of the time. Erika Flores states she is walking and weight training for 60 minutes 4-5 times per week.  Today's visit was #: 5 Starting weight: 182 lbs Starting date: 01/20/2021 Today's weight: 183 lbs Today's date: 03/25/2021 Total lbs lost to date: 0 Total lbs lost since last in-office visit: 1 lb  Interim History: Erika Flores feels she is eating more protein. She sometimes gets up and eats peanut butter during the night. She has started going to the gym with her boyfriend and has gained some muscle.  Subjective:   1. Prediabetes Erika Flores is on 0.5 mg weekly. She did not feel the 0.25 mg dose helped with appetite so she increased dose on her own.  Lab Results  Component Value Date   HGBA1C 5.5 01/20/2021   Lab Results  Component Value Date   INSULIN 20.0 01/20/2021    2. Other disorder of eating/emotional eating Erika Flores feels cravings are somewhat better on bupropion.   Assessment/Plan:   1. Prediabetes Erika Flores will continue to work on weight loss, exercise, and decreasing simple carbohydrates to help decrease the risk of diabetes. We will refill Ozempic 0.5 mg weekly.  - Semaglutide,0.25 or 0.5MG /DOS, (OZEMPIC, 0.25 OR 0.5 MG/DOSE,) 2 MG/1.5ML SOPN; Inject 0.25 mg into the skin once a week.  Dispense: 1.5 mL; Refill: 0  2. Other disorder of eating/emotional eating  We will refill bupropion 150 mg daily. Orders and follow up as documented in patient record.    - buPROPion (WELLBUTRIN SR) 150 MG 12 hr tablet; Take 1 tablet (150 mg total) by mouth daily.  Dispense: 30 tablet; Refill: 0  3. Obesity, current BMI 30.45 Erika Flores is currently in the action stage of change. As such, her goal is to continue with weight loss efforts. She has agreed to the  Category 2 Plan.   Erika Flores will limit snacks to 100 calories snacks during the night.  Exercise goals:  As is.  Behavioral modification strategies: increasing lean protein intake and better snacking choices.  Erika Flores has agreed to follow-up with our clinic in 2-3 weeks.  Objective:   Blood pressure (!) 131/91, pulse 94, temperature 98.9 F (37.2 C), height 5\' 5"  (1.651 m), weight 183 lb (83 kg), SpO2 97 %. Body mass index is 30.45 kg/m.  General: Cooperative, alert, well developed, in no acute distress. HEENT: Conjunctivae and lids unremarkable. Cardiovascular: Regular rhythm.  Lungs: Normal work of breathing. Neurologic: No focal deficits.   No results found for: CREATININE, BUN, NA, K, CL, CO2 No results found for: ALT, AST, GGT, ALKPHOS, BILITOT Lab Results  Component Value Date   HGBA1C 5.5 01/20/2021   Lab Results  Component Value Date   INSULIN 20.0 01/20/2021   Lab Results  Component Value Date   TSH 1.510 01/20/2021   Lab Results  Component Value Date   CHOL 156 01/20/2021   HDL 54 01/20/2021   LDLCALC 86 01/20/2021   TRIG 82 01/20/2021   Lab Results  Component Value Date   VD25OH 46.4 01/20/2021   No results found for: WBC, HGB, HCT, MCV, PLT No results found for: IRON, TIBC, FERRITIN  Attestation Statements:   Reviewed by clinician on day of visit: allergies, medications, problem list, medical history, surgical history, family history, social history,  and previous encounter notes.  I, Jackson Latino, RMA, am acting as Energy manager for [place], FNP.   I have reviewed the above documentation for accuracy and completeness, and I agree with the above. -  Jesse Sans, FNP

## 2021-04-14 ENCOUNTER — Encounter (INDEPENDENT_AMBULATORY_CARE_PROVIDER_SITE_OTHER): Payer: Self-pay | Admitting: Family Medicine

## 2021-04-14 ENCOUNTER — Ambulatory Visit (INDEPENDENT_AMBULATORY_CARE_PROVIDER_SITE_OTHER): Payer: No Typology Code available for payment source | Admitting: Family Medicine

## 2021-04-14 ENCOUNTER — Other Ambulatory Visit: Payer: Self-pay

## 2021-04-14 VITALS — BP 127/87 | HR 89 | Temp 97.8°F | Ht 65.0 in | Wt 183.0 lb

## 2021-04-14 DIAGNOSIS — Z6830 Body mass index (BMI) 30.0-30.9, adult: Secondary | ICD-10-CM

## 2021-04-14 DIAGNOSIS — E669 Obesity, unspecified: Secondary | ICD-10-CM

## 2021-04-14 DIAGNOSIS — F5089 Other specified eating disorder: Secondary | ICD-10-CM

## 2021-04-14 DIAGNOSIS — R7303 Prediabetes: Secondary | ICD-10-CM

## 2021-04-14 MED ORDER — BUPROPION HCL ER (SR) 150 MG PO TB12: 150.0000 mg | ORAL_TABLET | Freq: Every day | ORAL | 0 refills | Status: DC

## 2021-04-14 MED ORDER — OZEMPIC (0.25 OR 0.5 MG/DOSE) 2 MG/1.5ML ~~LOC~~ SOPN: 0.5000 mg | PEN_INJECTOR | SUBCUTANEOUS | 0 refills | Status: DC

## 2021-04-14 NOTE — Progress Notes (Signed)
Chief Complaint:   OBESITY Erika Flores is here to discuss her progress with her obesity treatment plan along with follow-up of her obesity related diagnoses. Erika Flores is on the Category 2 Plan and states she is following her eating plan approximately 40% of the time. Erika Flores states she is walking for 45-60 minutes 3 times per week.  Today's visit was #: 6 Starting weight: 182 lbs Starting date: 01/20/2021 Today's weight: 183 lbs Today's date: 04/14/2021 Total lbs lost to date: 0 Total lbs lost since last in-office visit: 0  Interim History: Erika Flores went on vacation and did not gain any weight. She was off plan with her eating. However, she feels the Ozempic helped moderate her portions. She is up some muscle mass and down fat percentage. She has been working out wit her boyfriend.  Subjective:   1. Prediabetes Erika Flores is on Ozempic 0.5 mg weekly. She notes mild nausea and constipation.   Lab Results  Component Value Date   HGBA1C 5.5 01/20/2021   Lab Results  Component Value Date   INSULIN 20.0 01/20/2021    2. Other disorder of eating/emotional eating Erika Flores notes mood is stable and craving are fairly well controlled. She does still get up and eat during the night about 2 times per week-usually peanut butter..  Assessment/Plan:   1. Prediabetes We will refill Ozempic 0.5 mg weekly. Erika Flores will continue to work on weight loss, exercise, and decreasing simple carbohydrates to help decrease the risk of diabetes.   - Semaglutide,0.25 or 0.5MG /DOS, (OZEMPIC, 0.25 OR 0.5 MG/DOSE,) 2 MG/1.5ML SOPN; Inject 0.5 mg into the skin once a week.  Dispense: 1.5 mL; Refill: 0  2. Other disorder of eating/emotional eating  We will refill bupropion 150 mg daily with 3 months supply with no refills. She will limit snacks calories at night to < than 100.Orders and follow up as documented in patient record.    - buPROPion (WELLBUTRIN SR) 150 MG 12 hr tablet; Take 1 tablet (150 mg total) by mouth daily.  Dispense:  90 tablet; Refill: 0  3. Obesity, current BMI 30.45 Erika Flores is currently in the action stage of change. As such, her goal is to continue with weight loss efforts. She has agreed to the Category 2 Plan.   Handout: Holiday strategies was given to Erika Flores today.   Exercise goals:  As is.  Behavioral modification strategies: increasing lean protein intake and decreasing simple carbohydrates.  Erika Flores has agreed to follow-up with our clinic. She cannot make a follow up right now due to constraints of [facility] coverage.   Objective:   Blood pressure 127/87, pulse 89, temperature 97.8 F (36.6 C), height 5\' 5"  (1.651 m), weight 183 lb (83 kg), SpO2 99 %. Body mass index is 30.45 kg/m.  General: Cooperative, alert, well developed, in no acute distress. HEENT: Conjunctivae and lids unremarkable. Cardiovascular: Regular rhythm.  Lungs: Normal work of breathing. Neurologic: No focal deficits.   No results found for: CREATININE, BUN, NA, K, CL, CO2 No results found for: ALT, AST, GGT, ALKPHOS, BILITOT Lab Results  Component Value Date   HGBA1C 5.5 01/20/2021   Lab Results  Component Value Date   INSULIN 20.0 01/20/2021   Lab Results  Component Value Date   TSH 1.510 01/20/2021   Lab Results  Component Value Date   CHOL 156 01/20/2021   HDL 54 01/20/2021   LDLCALC 86 01/20/2021   TRIG 82 01/20/2021   Lab Results  Component Value Date   VD25OH 46.4  01/20/2021   No results found for: WBC, HGB, HCT, MCV, PLT No results found for: IRON, TIBC, FERRITIN  Attestation Statements:   Reviewed by clinician on day of visit: allergies, medications, problem list, medical history, surgical history, family history, social history, and previous encounter notes.  I, Jackson Latino, RMA, am acting as Energy manager for [place], FNP.   I have reviewed the above documentation for accuracy and completeness, and I agree with the above. -  Jesse Sans, FNP

## 2022-01-20 ENCOUNTER — Encounter (INDEPENDENT_AMBULATORY_CARE_PROVIDER_SITE_OTHER): Payer: Self-pay

## 2022-09-13 ENCOUNTER — Ambulatory Visit (INDEPENDENT_AMBULATORY_CARE_PROVIDER_SITE_OTHER): Payer: No Typology Code available for payment source | Admitting: Bariatrics

## 2022-09-13 ENCOUNTER — Encounter: Payer: Self-pay | Admitting: Bariatrics

## 2022-09-13 VITALS — BP 130/85 | HR 68 | Temp 97.9°F | Ht 64.0 in | Wt 180.0 lb

## 2022-09-13 DIAGNOSIS — E038 Other specified hypothyroidism: Secondary | ICD-10-CM | POA: Diagnosis not present

## 2022-09-13 DIAGNOSIS — R7303 Prediabetes: Secondary | ICD-10-CM | POA: Diagnosis not present

## 2022-09-13 DIAGNOSIS — E669 Obesity, unspecified: Secondary | ICD-10-CM | POA: Diagnosis not present

## 2022-09-13 DIAGNOSIS — Z6830 Body mass index (BMI) 30.0-30.9, adult: Secondary | ICD-10-CM

## 2022-09-13 DIAGNOSIS — Z0289 Encounter for other administrative examinations: Secondary | ICD-10-CM

## 2022-09-13 DIAGNOSIS — F5081 Binge eating disorder: Secondary | ICD-10-CM

## 2022-09-13 NOTE — Progress Notes (Signed)
Office: [phone]  /  Fax: [phone]   Initial Visit  Erika Flores was seen in clinic today to evaluate for obesity. She is interested in losing weight to improve overall health and reduce the risk of weight related complications. She presents today to review program treatment options, initial physical assessment, and evaluation.     She was referred by: PCP  When asked what else they would like to accomplish? She states: Adopt healthier eating patterns  When asked how has your weight affected you? She states: Having fatigue and Problems with eating patterns  Some associated conditions: Prediabetes and Other: Hypothyroidism   Contributing factors: Family history and Eating patterns  Weight promoting medications identified: None  Current nutrition plan: Low-carb, High-protein, and Other: Paleo -like  Current level of physical activity: None and Walking  Current or previous pharmacotherapy: GLP-1  Response to medication: Lost weight and was able to maintain weight loss   Past medical history includes:   Past Medical History:  Diagnosis Date   ADHD    Anemia, unspecified    Anxiety    Asthma    Back pain    Constipation    Depression    Edema of both lower extremities    Hypercholesterolemia    Hypertension    Hypothyroidism    Joint pain    Mental health disorder    Plantar fasciitis    Prediabetes    Schizophrenia    Sleep apnea    Thyroid disease      Objective:   BP 130/85   Pulse 68   Temp 97.9 F (36.6 C)   Ht 5\' 4"  (1.626 m)   Wt 180 lb (81.6 kg)   SpO2 100%   BMI 30.90 kg/m  She was weighed on the bioimpedance scale: Body mass index is 30.9 kg/m.  Peak Weight:180 lbs , Body Fat%:31.3, Visceral Fat Rating:7, Weight trend over the last 12 months: fluctuating  General:  Alert, oriented and cooperative. Patient is in no acute distress.  Respiratory: Normal respiratory effort, no problems with respiration noted  Extremities: Normal range  of motion.    Mental Status: Normal mood and affect. Normal behavior. Normal judgment and thought content.   DIAGNOSTIC DATA REVIEWED:  BMET No results found for: "NA", "K", "CL", "CO2", "GLUCOSE", "BUN", "CREATININE", "CALCIUM", "GFRNONAA", "GFRAA" Lab Results  Component Value Date   HGBA1C 5.5 01/20/2021   Lab Results  Component Value Date   INSULIN 20.0 01/20/2021   CBC No results found for: "WBC", "RBC", "HGB", "HCT", "PLT", "MCV", "MCH", "MCHC", "RDW" Iron/TIBC/Ferritin/ %Sat No results found for: "IRON", "TIBC", "FERRITIN", "IRONPCTSAT" Lipid Panel     Component Value Date/Time   CHOL 156 01/20/2021 0845   TRIG 82 01/20/2021 0845   HDL 54 01/20/2021 0845   LDLCALC 86 01/20/2021 0845   Hepatic Function Panel  No results found for: "PROT", "ALBUMIN", "AST", "ALT", "ALKPHOS", "BILITOT", "BILIDIR", "IBILI"    Component Value Date/Time   TSH 1.510 01/20/2021 0845     Assessment and Plan:   Prediabetes:  Will return to the office for diet plan.   Other specified hypothyroidism:  Will continue medications. Labs at next visit.   Binge eating disorder:  Will continue medications.   Generalized obesity:  Will return for initial visit.   BMI 30.0-30.9,adult:  Discussed weight goals.      Obesity Treatment / Action Plan:  Patient will work on garnering support from family and friends to begin weight loss journey. Will work on  eliminating or reducing the presence of highly palatable, calorie dense foods in the home. Will complete provided nutritional and psychosocial assessment questionnaire before the next appointment. Will be scheduled for indirect calorimetry to determine resting energy expenditure in a fasting state.  This will allow [place] to create a reduced calorie, high-protein meal plan to promote loss of fat mass while preserving muscle mass.  Obesity Education Performed Today:  She was weighed on the bioimpedance scale and results were discussed and  documented in the synopsis.  We discussed obesity as a disease and the importance of a more detailed evaluation of all the factors contributing to the disease.  We discussed the importance of long term lifestyle changes which include nutrition, exercise and behavioral modifications as well as the importance of customizing this to her specific health and social needs.  We discussed the benefits of reaching a healthier weight to alleviate the symptoms of existing conditions and reduce the risks of the biomechanical, metabolic and psychological effects of obesity.  Erika Flores appears to be in the action stage of change and states they are ready to start intensive lifestyle modifications and behavioral modifications.  30 minutes was spent today on this visit including the above counseling, pre-visit chart review, and post-visit documentation.  Reviewed by clinician on day of visit: allergies, medications, problem list, medical history, surgical history, family history, social history, and previous encounter notes.    Eulanda Dorion A. Ruben ImO.

## 2022-09-29 ENCOUNTER — Other Ambulatory Visit: Payer: Self-pay

## 2022-09-29 ENCOUNTER — Ambulatory Visit (INDEPENDENT_AMBULATORY_CARE_PROVIDER_SITE_OTHER): Payer: No Typology Code available for payment source | Admitting: Bariatrics

## 2022-09-29 ENCOUNTER — Encounter: Payer: Self-pay | Admitting: Bariatrics

## 2022-09-29 VITALS — BP 120/80 | HR 63 | Temp 97.7°F | Ht 64.0 in | Wt 176.0 lb

## 2022-09-29 DIAGNOSIS — R632 Polyphagia: Secondary | ICD-10-CM | POA: Diagnosis not present

## 2022-09-29 DIAGNOSIS — R5383 Other fatigue: Secondary | ICD-10-CM

## 2022-09-29 DIAGNOSIS — E559 Vitamin D deficiency, unspecified: Secondary | ICD-10-CM

## 2022-09-29 DIAGNOSIS — E038 Other specified hypothyroidism: Secondary | ICD-10-CM

## 2022-09-29 DIAGNOSIS — E669 Obesity, unspecified: Secondary | ICD-10-CM

## 2022-09-29 DIAGNOSIS — Z6831 Body mass index (BMI) 31.0-31.9, adult: Secondary | ICD-10-CM | POA: Insufficient documentation

## 2022-09-29 DIAGNOSIS — R7303 Prediabetes: Secondary | ICD-10-CM

## 2022-09-29 DIAGNOSIS — Z1331 Encounter for screening for depression: Secondary | ICD-10-CM

## 2022-09-29 DIAGNOSIS — Z6830 Body mass index (BMI) 30.0-30.9, adult: Secondary | ICD-10-CM

## 2022-09-29 DIAGNOSIS — R0602 Shortness of breath: Secondary | ICD-10-CM | POA: Insufficient documentation

## 2022-09-29 DIAGNOSIS — F5081 Binge eating disorder: Secondary | ICD-10-CM

## 2022-09-29 DIAGNOSIS — Z0000 Encounter for general adult medical examination without abnormal findings: Secondary | ICD-10-CM | POA: Insufficient documentation

## 2022-09-29 MED ORDER — OZEMPIC (0.25 OR 0.5 MG/DOSE) 2 MG/3ML ~~LOC~~ SOPN
0.5000 mg | PEN_INJECTOR | SUBCUTANEOUS | 0 refills | Status: DC
Start: 2022-09-29 — End: 2022-09-29

## 2022-09-29 MED ORDER — OZEMPIC (0.25 OR 0.5 MG/DOSE) 2 MG/3ML ~~LOC~~ SOPN: 0.5000 mg | PEN_INJECTOR | SUBCUTANEOUS | 0 refills | Status: DC

## 2022-09-30 LAB — COMPREHENSIVE METABOLIC PANEL
ALT: 16 IU/L (ref 0–32)
AST: 16 IU/L (ref 0–40)
Albumin/Globulin Ratio: 2 (ref 1.2–2.2)
Albumin: 4.6 g/dL (ref 3.9–4.9)
Alkaline Phosphatase: 57 IU/L (ref 44–121)
BUN/Creatinine Ratio: 15 (ref 9–23)
BUN: 11 mg/dL (ref 6–24)
Bilirubin Total: 0.2 mg/dL (ref 0.0–1.2)
CO2: 25 mmol/L (ref 20–29)
Calcium: 9.6 mg/dL (ref 8.7–10.2)
Chloride: 101 mmol/L (ref 96–106)
Creatinine, Ser: 0.75 mg/dL (ref 0.57–1.00)
Globulin, Total: 2.3 g/dL (ref 1.5–4.5)
Glucose: 88 mg/dL (ref 70–99)
Potassium: 4.2 mmol/L (ref 3.5–5.2)
Sodium: 139 mmol/L (ref 134–144)
Total Protein: 6.9 g/dL (ref 6.0–8.5)
eGFR: 100 mL/min/{1.73_m2} (ref >59)

## 2022-09-30 LAB — HEMOGLOBIN A1C
Est. average glucose Bld gHb Est-mCnc: 114 mg/dL
Hgb A1c MFr Bld: 5.6 % (ref 4.8–5.6)

## 2022-09-30 LAB — INSULIN, RANDOM: INSULIN: 14.3 u[IU]/mL (ref 2.6–24.9)

## 2022-09-30 LAB — TSH+T4F+T3FREE
Free T4: 1.29 ng/dL (ref 0.82–1.77)
T3, Free: 2.9 pg/mL (ref 2.0–4.4)
TSH: 0.568 u[IU]/mL (ref 0.450–4.500)

## 2022-09-30 LAB — VITAMIN D 25 HYDROXY (VIT D DEFICIENCY, FRACTURES): Vit D, 25-Hydroxy: 81.2 ng/mL (ref 30.0–100.0)

## 2022-10-05 NOTE — Progress Notes (Signed)
Chief Complaint:   OBESITY Erika Flores (MR# [id number]) is a 47 y.o. female who presents for evaluation and treatment of obesity and related comorbidities. Current BMI is Body mass index is 30.21 kg/m. Erika Flores has been struggling with her weight for many years and has been unsuccessful in either losing weight, maintaining weight loss, or reaching her healthy weight goal.  Erika Flores had an information session with me on 09/13/2022, and she is here for her initial visit.  She likes to cook at times.  Erika Flores is currently in the action stage of change and ready to dedicate time achieving and maintaining a healthier weight. Erika Flores is interested in becoming our patient and working on intensive lifestyle modifications including (but not limited to) diet and exercise for weight loss.  Erika Flores's habits were reviewed today and are as follows: Her family eats meals together, she thinks her family will eat healthier with her, her desired weight loss is 16 lbs, she has been heavy most of her life, she started gaining weight during the Winter/depression, her heaviest weight ever was 235 pounds, she is a picky eater and doesn't like to eat healthier foods, she has significant food cravings issues, she snacks frequently in the evenings, she wakes up frequently in the middle of the night to eat, she skips meals frequently, she is frequently drinking liquids with calories, she frequently makes poor food choices, she has problems with excessive hunger, she has binge eating behaviors, and she struggles with emotional eating.  Depression Screen Erika Flores's Food and Mood (modified PHQ-9) score was 12.  Subjective:   1. Other fatigue Erika Flores admits to daytime somnolence and admits to waking up still tired. Patient has a history of symptoms of daytime fatigue, morning fatigue, and morning headache. Erika Flores generally gets 5 hours of sleep per night, and states that she has nightime awakenings. Snoring is not present. Apneic episodes are  not present. Epworth Sleepiness Score is 10.   2. SOB (shortness of breath) on exertion Erika Flores notes increasing shortness of breath with exercising and seems to be worsening over time with weight gain. She notes getting out of breath sooner with activity than she used to. This has not gotten worse recently. Erika Flores denies shortness of breath at rest or orthopnea.  3. Prediabetes Erika Flores was diagnosed with prediabetes in 2019.  4. Other specified hypothyroidism Erika Flores is taking Synthroid.  5. Vitamin D deficiency Erika Flores's last vitamin D level was okay.  6. Binge eating disorder Erika Flores is seeing a Veterinary surgeon.  She is taking Vyvanse, and she notes it helps some.  7. Polyphagia Erika Flores has a history of hysterectomy.  She notes hunger all day and cravings all day.  8. Health care maintenance Given obesity.   Assessment/Plan:   1. Other fatigue Erika Flores does feel that her weight is causing her energy to be lower than it should be. Fatigue may be related to obesity, depression or many other causes. Labs will be ordered, and in the meanwhile, Erika Flores will focus on self care including making healthy food choices, increasing physical activity and focusing on stress reduction.  - EKG 12-Lead - TSH+T4F+T3Free - Comprehensive metabolic panel  2. SOB (shortness of breath) on exertion Erika Flores does feel that she gets out of breath more easily that she used to when she exercises. Erika Flores's shortness of breath appears to be obesity related and exercise induced. She has agreed to work on weight loss and gradually increase exercise to treat her exercise induced shortness of breath. Will  continue to monitor closely.  - TSH+T4F+T3Free - Comprehensive metabolic panel  3. Prediabetes We will check labs today.  Protein shake handout was provided.  - Lipid Panel With LDL/HDL Ratio - Insulin, random - Hemoglobin A1c - Comprehensive metabolic panel  4. Other specified hypothyroidism We will check labs today.  Erika Flores will  continue Synthroid.  5. Vitamin D deficiency We will check labs today.  Erika Flores will continue vitamin D OTC.  - VITAMIN D 25 Hydroxy (Vit-D Deficiency, Fractures)  6. Binge eating disorder Erika Flores will continue with her counselor, and will not have trigger foods.  7. Erika Flores agreed to start Ozempic at 0.5 mg once weekly with no refills.  - Semaglutide,0.25 or 0.5MG /DOS, (OZEMPIC, 0.25 OR 0.5 MG/DOSE,) 2 MG/3ML SOPN; Inject 0.5 mg into the skin once a week.  Dispense: 3 mL; Refill: 0  8. Health care maintenance We will check labs today. EKG and IC were done today, and results were discussed with the patient.   9. Depression screening Erika Flores had a positive depression screening. Depression is commonly associated with obesity and often results in emotional eating behaviors. We will monitor this closely and work on CBT to help improve the non-hunger eating patterns. Referral to Psychology may be required if no improvement is seen as she continues in our clinic.  10. Generalized obesity  11. BMI 30.0-30.9,adult Erika Flores is currently in the action stage of change and her goal is to continue with weight loss efforts. I recommend Erika Flores begin the structured treatment plan as follows:  She has agreed to the Category 2 Plan.  Meal planning was discussed. Eating Out handout was given.   Exercise goals: No exercise has been prescribed at this time.   Behavioral modification strategies: increasing lean protein intake, decreasing simple carbohydrates, increasing vegetables, increasing water intake, decreasing eating out, no skipping meals, meal planning and cooking strategies, keeping healthy foods in the home, and planning for success.  She was informed of the importance of frequent follow-up visits to maximize her success with intensive lifestyle modifications for her multiple health conditions. She was informed we would discuss her lab results at her next visit unless there is a critical issue that  needs to be addressed sooner. [name] agreed to keep her next visit at the agreed upon time to discuss these results.  Objective:   Blood pressure 120/80, pulse 63, temperature 97.7 F (36.5 C), height  (1.626 m), weight 176 lb (79.8 kg), SpO2 98 %. Body mass index is 30.21 kg/m.  EKG: Normal sinus rhythm, rate 64 BPM.  Indirect Calorimeter completed today shows a VO2 of 226 and a REE of 1555.  Her calculated basal metabolic rate is 1610 thus her basal metabolic rate is worse than expected.  General: Cooperative, alert, well developed, in no acute distress. HEENT: Conjunctivae and lids unremarkable. Cardiovascular: Regular rhythm.  Lungs: Normal work of breathing. Neurologic: No focal deficits.   Lab Results  Component Value Date   CREATININE 0.75 09/29/2022   BUN 11 09/29/2022   NA 139 09/29/2022   K 4.2 09/29/2022   CL 101 09/29/2022   CO2 25 09/29/2022   Lab Results  Component Value Date   ALT 16 09/29/2022   AST 16 09/29/2022   ALKPHOS 57 09/29/2022   BILITOT 0.2 09/29/2022   Lab Results  Component Value Date   HGBA1C 5.6 09/29/2022   HGBA1C 5.5 01/20/2021   Lab Results  Component Value Date   INSULIN 14.3 09/29/2022   INSULIN 20.0 01/20/2021  Lab Results  Component Value Date   TSH 0.568 09/29/2022   Lab Results  Component Value Date   CHOL 156 01/20/2021   HDL 54 01/20/2021   LDLCALC 86 01/20/2021   TRIG 82 01/20/2021   No results found for: "WBC", "HGB", "HCT", "MCV", "PLT" No results found for: "IRON", "TIBC", "FERRITIN"  Attestation Statements:   Reviewed by clinician on day of visit: allergies, medications, problem list, medical history, surgical history, family history, social history, and previous encounter notes.   Trude Mcburney, am acting as Energy manager for [employer], DO.  I have reviewed the above documentation for accuracy and completeness, and I agree with the above. Corinna Capra, DO

## 2022-10-11 ENCOUNTER — Encounter (INDEPENDENT_AMBULATORY_CARE_PROVIDER_SITE_OTHER): Payer: Self-pay | Admitting: Bariatrics

## 2022-10-11 ENCOUNTER — Encounter: Payer: Self-pay | Admitting: Bariatrics

## 2022-10-11 DIAGNOSIS — E88819 Insulin resistance, unspecified: Secondary | ICD-10-CM | POA: Insufficient documentation

## 2022-10-13 ENCOUNTER — Ambulatory Visit (INDEPENDENT_AMBULATORY_CARE_PROVIDER_SITE_OTHER): Payer: No Typology Code available for payment source | Admitting: Bariatrics

## 2022-10-13 ENCOUNTER — Encounter: Payer: Self-pay | Admitting: Bariatrics

## 2022-10-13 VITALS — BP 141/99 | HR 75 | Temp 97.7°F | Ht 64.0 in | Wt 180.0 lb

## 2022-10-13 DIAGNOSIS — F509 Eating disorder, unspecified: Secondary | ICD-10-CM | POA: Insufficient documentation

## 2022-10-13 DIAGNOSIS — E669 Obesity, unspecified: Secondary | ICD-10-CM

## 2022-10-13 DIAGNOSIS — Z6830 Body mass index (BMI) 30.0-30.9, adult: Secondary | ICD-10-CM | POA: Diagnosis not present

## 2022-10-13 DIAGNOSIS — F5089 Other specified eating disorder: Secondary | ICD-10-CM

## 2022-10-13 DIAGNOSIS — E88819 Insulin resistance, unspecified: Secondary | ICD-10-CM | POA: Diagnosis not present

## 2022-10-13 DIAGNOSIS — Z6831 Body mass index (BMI) 31.0-31.9, adult: Secondary | ICD-10-CM

## 2022-10-13 MED ORDER — ZEPBOUND 2.5 MG/0.5ML ~~LOC~~ SOAJ
2.5000 mg | SUBCUTANEOUS | 0 refills | Status: DC
Start: 2022-10-13 — End: 2022-10-28

## 2022-10-19 NOTE — Progress Notes (Signed)
Chief Complaint:   OBESITY Erika Flores is here to discuss her progress with her obesity treatment plan along with follow-up of her obesity related diagnoses. Erika Flores is on the Category 2 Plan and states she is following her eating plan approximately 50% of the time. Erika Flores states she is at the gym for 15 minutes 4 times per week.  Today's visit was #: 2 Starting weight: 176 lbs Starting date: 09/29/2022 Today's weight: 180 lbs Today's date: 10/13/2022 Total lbs lost to date: 0 Total lbs lost since last in-office visit: 0  Interim History: Erika Flores is up 4 pounds.  She has been on vacation and doing less meal planning.  She is getting more water and protein in.  Subjective:   1. Insulin resistance Erika Flores's recent insulin level was 14.3 and A1c 5.6.  2. Other disorder of eating Erika Flores is seeing a counselor, and she is on Vyvanse.  She notes some binging at night.  Assessment/Plan:   1. Insulin resistance Erika Flores agreed to start  Zepbound at 2.5 mg once weekly with no refills. Handouts on insulin resistance and prediabetes were given to the patient today.  Erika Flores is to work on having regular meals.  - tirzepatide (ZEPBOUND) 2.5 MG/0.5ML Pen; Inject 2.5 mg into the skin once a week.  Dispense: 2 mL; Refill: 0  2. Other disorder of eating Erika Flores will continue Vyvanse, and she will work on sleep behaviors and eating.  3. Generalized obesity  4. BMI 31.0-31.9,adult Erika Flores is currently in the action stage of change. As such, her goal is to continue with weight loss efforts. She has agreed to the Category 2 Plan.   She will adhere to the plan 85-95%.  Meal planning was discussed.  Review labs with the patient from 09/29/2022, CMP, vitamin D, glucose, A1c, insulin, and thyroid panel.  She will get more into a routine.  Exercise goals: As is.  Behavioral modification strategies: increasing lean protein intake, decreasing simple carbohydrates, increasing vegetables, increasing water intake, decreasing eating  out, no skipping meals, meal planning and cooking strategies, keeping healthy foods in the home, and planning for success.  Erika Flores has agreed to follow-up with our clinic in 2 weeks. She was informed of the importance of frequent follow-up visits to maximize her success with intensive lifestyle modifications for her multiple health conditions.   Objective:   Blood pressure (!) 141/99, pulse 75, temperature 97.7 F (36.5 C), height 5\' 4"  (1.626 m), weight 180 lb (81.6 kg), SpO2 98 %. Body mass index is 30.9 kg/m.  General: Cooperative, alert, well developed, in no acute distress. HEENT: Conjunctivae and lids unremarkable. Cardiovascular: Regular rhythm.  Lungs: Normal work of breathing. Neurologic: No focal deficits.   Lab Results  Component Value Date   CREATININE 0.75 09/29/2022   BUN 11 09/29/2022   NA 139 09/29/2022   K 4.2 09/29/2022   CL 101 09/29/2022   CO2 25 09/29/2022   Lab Results  Component Value Date   ALT 16 09/29/2022   AST 16 09/29/2022   ALKPHOS 57 09/29/2022   BILITOT 0.2 09/29/2022   Lab Results  Component Value Date   HGBA1C 5.6 09/29/2022   HGBA1C 5.5 01/20/2021   Lab Results  Component Value Date   INSULIN 14.3 09/29/2022   INSULIN 20.0 01/20/2021   Lab Results  Component Value Date   TSH 0.568 09/29/2022   Lab Results  Component Value Date   CHOL 156 01/20/2021   HDL 54 01/20/2021   LDLCALC 86 01/20/2021  TRIG 82 01/20/2021   Lab Results  Component Value Date   VD25OH 81.2 09/29/2022   VD25OH 46.4 01/20/2021   No results found for: "WBC", "HGB", "HCT", "MCV", "PLT" No results found for: "IRON", "TIBC", "FERRITIN"  Attestation Statements:   Reviewed by clinician on day of visit: allergies, medications, problem list, medical history, surgical history, family history, social history, and previous encounter notes.   Trude Mcburney, am acting as Energy manager for [employer], DO.  I have reviewed the above documentation for  accuracy and completeness, and I agree with the above. Corinna Capra, DO

## 2022-10-28 ENCOUNTER — Encounter: Payer: Self-pay | Admitting: Bariatrics

## 2022-10-28 ENCOUNTER — Ambulatory Visit (INDEPENDENT_AMBULATORY_CARE_PROVIDER_SITE_OTHER): Payer: No Typology Code available for payment source | Admitting: Bariatrics

## 2022-10-28 VITALS — BP 131/84 | HR 84 | Temp 98.0°F | Ht 64.0 in | Wt 176.0 lb

## 2022-10-28 DIAGNOSIS — Z6831 Body mass index (BMI) 31.0-31.9, adult: Secondary | ICD-10-CM

## 2022-10-28 DIAGNOSIS — Z6830 Body mass index (BMI) 30.0-30.9, adult: Secondary | ICD-10-CM

## 2022-10-28 DIAGNOSIS — R632 Polyphagia: Secondary | ICD-10-CM

## 2022-10-28 DIAGNOSIS — E88819 Insulin resistance, unspecified: Secondary | ICD-10-CM

## 2022-10-28 DIAGNOSIS — E669 Obesity, unspecified: Secondary | ICD-10-CM

## 2022-10-28 MED ORDER — ZEPBOUND 5 MG/0.5ML ~~LOC~~ SOAJ
5.0000 mg | SUBCUTANEOUS | 0 refills | Status: DC
Start: 2022-10-28 — End: 2022-11-24

## 2022-11-01 NOTE — Progress Notes (Unsigned)
Chief Complaint:   OBESITY Erika Flores is here to discuss her progress with her obesity treatment plan along with follow-up of her obesity related diagnoses. Erika Flores is on the Category 2 Plan and states she is following her eating plan approximately 65% of the time. Erika Flores states she is at the gym for 45 minutes 4-5 times per week.  Today's visit was #: 3 Starting weight: 176 lbs Starting date: 09/29/2022 Today's weight: 176 lbs Today's date: 10/28/2022 Total lbs lost to date: 0 Total lbs lost since last in-office visit: 4  Interim History: Erika Flores is down 4 pounds since her last visit.  Subjective:   1. Insulin resistance Erika Flores is taking Zepbound as directed. She notes increased hunger after 3 days.   2. Polyphagia Erika Flores is taking Zepbound.   Assessment/Plan:   1. Insulin resistance Erika Flores will continue Zepbound 5 mg once weekly, and we will refill for 1 month.   - tirzepatide (ZEPBOUND) 5 MG/0.5ML Pen; Inject 5 mg into the skin once a week.  Dispense: 2 mL; Refill: 0  2. Polyphagia Erika Flores will continue Zepbound, and she will keep her protein, water, and fiber intake high.  3. Generalized obesity  4. BMI 31.0-31.9,adult Erika Flores is currently in the action stage of change. As such, her goal is to continue with weight loss efforts. She has agreed to the Category 2 Plan.   Meal planning and intentional eating were discussed.  Exercise goals: As is.   Behavioral modification strategies: increasing lean protein intake, decreasing simple carbohydrates, increasing vegetables, increasing water intake, decreasing eating out, no skipping meals, meal planning and cooking strategies, keeping healthy foods in the home, and planning for success.  Erika Flores has agreed to follow-up with our clinic in 2 weeks. She was informed of the importance of frequent follow-up visits to maximize her success with intensive lifestyle modifications for her multiple health conditions.   Objective:   Blood pressure 131/84,  pulse 84, temperature 98 F (36.7 C), height 5\' 4"  (1.626 m), weight 176 lb (79.8 kg), SpO2 99 %. Body mass index is 30.21 kg/m.  General: Cooperative, alert, well developed, in no acute distress. HEENT: Conjunctivae and lids unremarkable. Cardiovascular: Regular rhythm.  Lungs: Normal work of breathing. Neurologic: No focal deficits.   Lab Results  Component Value Date   CREATININE 0.75 09/29/2022   BUN 11 09/29/2022   NA 139 09/29/2022   K 4.2 09/29/2022   CL 101 09/29/2022   CO2 25 09/29/2022   Lab Results  Component Value Date   ALT 16 09/29/2022   AST 16 09/29/2022   ALKPHOS 57 09/29/2022   BILITOT 0.2 09/29/2022   Lab Results  Component Value Date   HGBA1C 5.6 09/29/2022   HGBA1C 5.5 01/20/2021   Lab Results  Component Value Date   INSULIN 14.3 09/29/2022   INSULIN 20.0 01/20/2021   Lab Results  Component Value Date   TSH 0.568 09/29/2022   Lab Results  Component Value Date   CHOL 156 01/20/2021   HDL 54 01/20/2021   LDLCALC 86 01/20/2021   TRIG 82 01/20/2021   Lab Results  Component Value Date   VD25OH 81.2 09/29/2022   VD25OH 46.4 01/20/2021   No results found for: "WBC", "HGB", "HCT", "MCV", "PLT" No results found for: "IRON", "TIBC", "FERRITIN"  Attestation Statements:   Reviewed by clinician on day of visit: allergies, medications, problem list, medical history, surgical history, family history, social history, and previous encounter notes.   Trude Mcburney, am acting  as transcriptionist for [employer], DO.  I have reviewed the above documentation for accuracy and completeness, and I agree with the above. Corinna Capra, DO

## 2022-11-03 ENCOUNTER — Encounter: Payer: Self-pay | Admitting: Bariatrics

## 2022-11-24 ENCOUNTER — Encounter: Payer: Self-pay | Admitting: Bariatrics

## 2022-11-24 ENCOUNTER — Ambulatory Visit (INDEPENDENT_AMBULATORY_CARE_PROVIDER_SITE_OTHER): Payer: No Typology Code available for payment source | Admitting: Bariatrics

## 2022-11-24 VITALS — BP 138/92 | HR 80 | Temp 97.8°F | Ht 64.0 in | Wt 166.0 lb

## 2022-11-24 DIAGNOSIS — Z6828 Body mass index (BMI) 28.0-28.9, adult: Secondary | ICD-10-CM | POA: Diagnosis not present

## 2022-11-24 DIAGNOSIS — E669 Obesity, unspecified: Secondary | ICD-10-CM | POA: Diagnosis not present

## 2022-11-24 DIAGNOSIS — R632 Polyphagia: Secondary | ICD-10-CM

## 2022-11-24 DIAGNOSIS — E88819 Insulin resistance, unspecified: Secondary | ICD-10-CM | POA: Diagnosis not present

## 2022-11-24 MED ORDER — ZEPBOUND 5 MG/0.5ML ~~LOC~~ SOAJ: 5.0000 mg | SUBCUTANEOUS | 0 refills | Status: DC

## 2022-11-24 NOTE — Progress Notes (Signed)
WEIGHT SUMMARY AND BIOMETRICS  Weight Lost Since Last Visit: 10lb   Vitals Temp: 97.8 F (36.6 C) BP: (!) 138/92 Pulse Rate: 80 SpO2: 99 %   Anthropometric Measurements Height: 5\' 4"  (1.626 m) Weight: 166 lb (75.3 kg) BMI (Calculated): 28.48 Weight at Last Visit: 176lb Weight Lost Since Last Visit: 10lb Starting Weight: 176lb Total Weight Loss (lbs): 10 lb (4.536 kg)   Body Composition  Body Fat %: 28.9 % Fat Mass (lbs): 48 lbs Muscle Mass (lbs): 112 lbs Total Body Water (lbs): 75.2 lbs Visceral Fat Rating : 6   Other Clinical Data Fasting: no Labs: no Today's Visit #: 4 Starting Date: 09/29/22    OBESITY Erika Flores is here to discuss her progress with her obesity treatment plan along with follow-up of her obesity related diagnoses.     Nutrition Plan: the Category 2 plan - 60% adherence.  Current exercise: elliptical bike  Interim History:  She is down 10 lbs since her last visit.  Is skipping meals and Water intake is adequate.  Pharmacotherapy: Erika Flores is on Zepbound 5.0 mg SQ weekly Adverse side effects: None Hunger is moderately controlled.  Cravings are moderately controlled.  Assessment/Plan:   1. Insulin resistance  Insulin Resistance Erika Flores has had elevated fasting insulin readings. Goal is HgbA1c < 5.7, fasting insulin at l0 or less, and preferably at 5.  She  denies polyphagia. Medication(s): Zepboud Lab Results  Component Value Date   HGBA1C 5.6 09/29/2022   Lab Results  Component Value Date   INSULIN 14.3 09/29/2022   INSULIN 20.0 01/20/2021    Plan Medication(s): Zepbound 5.0 mg SQ weekly Will work on the agreed upon plan. Will minimize refined carbohydrates ( sweets and starches), and focus more on complex carbohydrates.  Increase the micronutrients found in leafy greens, which include magnesium, polyphenols, and vitamin C which have been postulated to help with insulin sensitivity. Minimize "fast food" and cook more  meals at home.  Increase fiber to 25 to 30 grams daily.  Information sheet on " Insulin Resistance and Prediabetes".    Polyphagia Erika Flores endorses excessive hunger.  Medication(s): Zepbound Effects of medication:  moderately controlled. Cravings are moderately controlled.   Plan: Medication(s): Zepbound 5.0 mg SQ weekly Will increase water, protein and fiber to help assuage hunger.  Will minimize foods that have a high glucose index/load to minimize reactive hypoglycemia.      Generalized Obesity: Current BMI BMI (Calculated): 28.48   Pharmacotherapy Plan Continue and refill  Zepbound 5.0 mg SQ weekly  Erika Flores is currently in the action stage of change. As such, her goal is to continue with weight loss efforts.  She has agreed to the Category 2 plan.  Exercise goals: For substantial health benefits, adults should do at least 150 minutes (2 hours and 30 minutes) a week of moderate-intensity, or 75 minutes (1 hour and 15 minutes) a week of vigorous-intensity aerobic physical activity, or an equivalent combination of moderate- and vigorous-intensity aerobic activity. Aerobic activity should be performed in episodes of at least 10 minutes, and preferably, it should be spread throughout the week.  Behavioral modification strategies: increasing lean protein intake, no meal skipping, decrease eating out, meal planning , better snacking choices, and planning for success.  Erika Flores has agreed to follow-up with our clinic in 2 weeks.       Objective:   VITALS: Per patient if applicable, see vitals. GENERAL: Alert and in no acute distress. CARDIOPULMONARY: No increased WOB. Speaking in clear sentences.  PSYCH:  Pleasant and cooperative. Speech normal rate and rhythm. Affect is appropriate. Insight and judgement are appropriate. Attention is focused, linear, and appropriate.  NEURO: Oriented as arrived to appointment on time with no prompting.   Attestation Statements:    This was prepared with  the assistance of Engineer, civil (consulting).  Occasional wrong-word or sound-a-like substitutions may have occurred due to the inherent limitations of voice recognition software.   Corinna Capra, DO

## 2022-12-09 ENCOUNTER — Encounter: Payer: Self-pay | Admitting: Bariatrics

## 2022-12-09 ENCOUNTER — Ambulatory Visit (INDEPENDENT_AMBULATORY_CARE_PROVIDER_SITE_OTHER): Payer: No Typology Code available for payment source | Admitting: Nurse Practitioner

## 2022-12-09 ENCOUNTER — Encounter: Payer: Self-pay | Admitting: Nurse Practitioner

## 2022-12-09 VITALS — BP 122/90 | HR 81 | Temp 97.8°F | Ht 64.0 in | Wt 161.0 lb

## 2022-12-09 DIAGNOSIS — E669 Obesity, unspecified: Secondary | ICD-10-CM | POA: Diagnosis not present

## 2022-12-09 DIAGNOSIS — Z6827 Body mass index (BMI) 27.0-27.9, adult: Secondary | ICD-10-CM | POA: Diagnosis not present

## 2022-12-09 DIAGNOSIS — E88819 Insulin resistance, unspecified: Secondary | ICD-10-CM | POA: Diagnosis not present

## 2022-12-09 MED ORDER — ZEPBOUND 5 MG/0.5ML ~~LOC~~ SOAJ: 5.0000 mg | SUBCUTANEOUS | 0 refills | Status: DC

## 2022-12-09 NOTE — Progress Notes (Signed)
Office: [phone]  /  Fax: [phone]  WEIGHT SUMMARY AND BIOMETRICS  Weight Lost Since Last Visit: 5lb  No data recorded  Vitals Temp: 97.8 F (36.6 C) BP: (!) 122/90 Pulse Rate: 81 SpO2: 100 %   Anthropometric Measurements Height: 5\' 4"  (1.626 m) Weight: 161 lb (73 kg) BMI (Calculated): 27.62 Weight at Last Visit: 166lb Weight Lost Since Last Visit: 5lb Starting Weight: 176lb Total Weight Loss (lbs): 15 lb (6.804 kg)   Body Composition  Body Fat %: 27.5 % Fat Mass (lbs): 44.4 lbs Muscle Mass (lbs): 111 lbs Total Body Water (lbs): 73.6 lbs Visceral Fat Rating : 5   Other Clinical Data Fasting: Yes Labs: No Today's Visit #: 5 Starting Date: 09/29/22     HPI  Chief Complaint: OBESITY  Erika Flores is here to discuss her progress with her obesity treatment plan. She is on the the Category 2 Plan and states she is following her eating plan approximately 40 % of the time. She states she is exercising 60 minutes 2 days per week.   Interval History:  Since last office visit she has lost 5 pounds.  She is working on increasing her protein intake. Goal weight:  135 lbs.     Pharmacotherapy for weight loss: She is currently taking Zepbound 5mg  for medical weight loss.  Denies side effects.    Previous pharmacotherapy for medical weight loss:  None  Bariatric surgery:  Patient has not had bariatric surgery.     PHYSICAL EXAM:  Blood pressure (!) 122/90, pulse 81, temperature 97.8 F (36.6 C), height 5\' 4"  (1.626 m), weight 161 lb (73 kg), SpO2 100 %. Body mass index is 27.64 kg/m.  General: She is overweight, cooperative, alert, well developed, and in no acute distress. PSYCH: Has normal mood, affect and thought process.   Extremities: No edema.  Neurologic: No gross sensory or motor deficits. No tremors or fasciculations noted.    DIAGNOSTIC DATA REVIEWED:  BMET    Component Value Date/Time   NA 139 09/29/2022 1000   K 4.2 09/29/2022 1000   CL  101 09/29/2022 1000   CO2 25 09/29/2022 1000   GLUCOSE 88 09/29/2022 1000   BUN 11 09/29/2022 1000   CREATININE 0.75 09/29/2022 1000   CALCIUM 9.6 09/29/2022 1000   Lab Results  Component Value Date   HGBA1C 5.6 09/29/2022   HGBA1C 5.5 01/20/2021   Lab Results  Component Value Date   INSULIN 14.3 09/29/2022   INSULIN 20.0 01/20/2021   Lab Results  Component Value Date   TSH 0.568 09/29/2022   CBC No results found for: "WBC", "RBC", "HGB", "HCT", "PLT", "MCV", "MCH", "MCHC", "RDW" Iron Studies No results found for: "IRON", "TIBC", "FERRITIN", "IRONPCTSAT" Lipid Panel     Component Value Date/Time   CHOL 156 01/20/2021 0845   TRIG 82 01/20/2021 0845   HDL 54 01/20/2021 0845   LDLCALC 86 01/20/2021 0845   Hepatic Function Panel     Component Value Date/Time   PROT 6.9 09/29/2022 1000   ALBUMIN 4.6 09/29/2022 1000   AST 16 09/29/2022 1000   ALT 16 09/29/2022 1000   ALKPHOS 57 09/29/2022 1000   BILITOT 0.2 09/29/2022 1000      Component Value Date/Time   TSH 0.568 09/29/2022 1000   Nutritional Lab Results  Component Value Date   VD25OH 81.2 09/29/2022   VD25OH 46.4 01/20/2021     ASSESSMENT AND PLAN  TREATMENT PLAN FOR OBESITY:  Recommended Dietary Goals  Brentlee is  currently in the action stage of change. As such, her goal is to continue weight management plan. She has agreed to the Category 2 Plan.  Behavioral Intervention  We discussed the following Behavioral Modification Strategies today: increasing lean protein intake, decreasing simple carbohydrates , increasing vegetables, increasing lower glycemic fruits, increasing fiber rich foods, avoiding skipping meals, increasing water intake, keeping healthy foods at home, continue to practice mindfulness when eating, and planning for success.  Additional resources provided today: NA  Recommended Physical Activity Goals  [place] has been advised to work up to 150 minutes of moderate intensity aerobic  activity a week and strengthening exercises 2-3 times per week for cardiovascular health, weight loss maintenance and preservation of muscle mass.   She has agreed to Continue current level of physical activity  and Think about ways to increase daily physical activity and overcoming barriers to exercise   Pharmacotherapy We discussed various medication options to help [place] with her weight loss efforts and we both agreed to continue Zepbound 5mg .  Side effects discussed.  Not to get pregnant while taking weight loss medications.    ASSOCIATED CONDITIONS ADDRESSED TODAY  Action/Plan  Insulin resistance -     Zepbound; Inject 5 mg into the skin once a week.  Dispense: 2 mL; Refill: 0  Generalized obesity  BMI 27.0-27.9,adult      Last labs 09/29/22   Return in about 3 weeks (around 12/30/2022).Marland Kitchen She was informed of the importance of frequent follow up visits to maximize her success with intensive lifestyle modifications for her multiple health conditions.   ATTESTASTION STATEMENTS:  Reviewed by clinician on day of visit: allergies, medications, problem list, medical history, surgical history, family history, social history, and previous encounter notes.     Theodis Sato. Shakeela Rabadan FNP-C

## 2022-12-27 ENCOUNTER — Encounter: Payer: Self-pay | Admitting: Bariatrics

## 2022-12-27 ENCOUNTER — Ambulatory Visit (INDEPENDENT_AMBULATORY_CARE_PROVIDER_SITE_OTHER): Payer: No Typology Code available for payment source | Admitting: Bariatrics

## 2022-12-27 VITALS — BP 152/97 | HR 82 | Temp 97.8°F | Ht 64.0 in | Wt 159.0 lb

## 2022-12-27 DIAGNOSIS — E038 Other specified hypothyroidism: Secondary | ICD-10-CM

## 2022-12-27 DIAGNOSIS — R632 Polyphagia: Secondary | ICD-10-CM | POA: Diagnosis not present

## 2022-12-27 DIAGNOSIS — Z6827 Body mass index (BMI) 27.0-27.9, adult: Secondary | ICD-10-CM

## 2022-12-27 DIAGNOSIS — E039 Hypothyroidism, unspecified: Secondary | ICD-10-CM

## 2022-12-27 DIAGNOSIS — E669 Obesity, unspecified: Secondary | ICD-10-CM | POA: Diagnosis not present

## 2022-12-27 MED ORDER — ZEPBOUND 7.5 MG/0.5ML ~~LOC~~ SOAJ: 7.5000 mg | SUBCUTANEOUS | 0 refills | Status: DC

## 2022-12-27 NOTE — Progress Notes (Signed)
WEIGHT SUMMARY AND BIOMETRICS  Weight Lost Since Last Visit: 2lb  No data recorded  Vitals Temp: 97.8 F (36.6 C) BP: (!) 152/97 Pulse Rate: 82 SpO2: 97 %   Anthropometric Measurements Height: 5\' 4"  (1.626 m) Weight: 159 lb (72.1 kg) BMI (Calculated): 27.28 Weight at Last Visit: 161lb Weight Lost Since Last Visit: 2lb Starting Weight: 176lb Total Weight Loss (lbs): 17 lb (7.711 kg)   Body Composition  Body Fat %: 27.5 % Fat Mass (lbs): 43.8 lbs Muscle Mass (lbs): 110 lbs Total Body Water (lbs): 74.6 lbs Visceral Fat Rating : 5   Other Clinical Data Fasting: yes Labs: no Today's Visit #: 6 Starting Date: 09/29/22    OBESITY Erika Flores is here to discuss her progress with her obesity treatment plan along with follow-up of her obesity related diagnoses.     Nutrition Plan: the Category 2 plan - 68% adherence.  Current exercise: swimming  Interim History:  She is down 2 lbs.  Eating all of the food on the plan., Protein intake is as prescribed, Is skipping meals, and Water intake is adequate.  Pharmacotherapy: Erika Flores is on Zepbound 5.0 mg SQ weekly Adverse side effects: None Hunger is moderately controlled.  Cravings are moderately controlled.  Assessment/Plan:   Erika Flores endorses excessive hunger.  Medication(s): Zepbound Effects of medication:  moderately controlled. Cravings are moderately controlled.   Plan: Medication(s): Zepbound 7.5 mg SQ weekly Her dose was increased from 5.0 mg to 75 mg.  Will increase water, protein and fiber to help assuage hunger.  Will minimize foods that have a high glucose index/load to minimize reactive hypoglycemia.   Hypothyroidism Stable.  Does not report symptoms associated with uncontrolled hypothyroidism. Medication(s): Levothyroxine 150 mcg daily.  Plan: Continue levothyroxine at current dose. Counseling: The correct way to take levothyroxine is fasting, with water, separated by at least 30  minutes from breakfast, and separated by more than 4 hours from calcium, iron, multivitamins, acid reflux medications (PPIs).       Generalized Obesity: Current BMI BMI (Calculated): 27.28   Pharmacotherapy Plan Continue and refill  Zepbound 7.5 mg SQ weekly  Erika Flores is currently in the action stage of change. As such, her goal is to maintain weight for now.  She has agreed to the Category 2 plan.  Exercise goals: For substantial health benefits, adults should do at least 150 minutes (2 hours and 30 minutes) a week of moderate-intensity, or 75 minutes (1 hour and 15 minutes) a week of vigorous-intensity aerobic physical activity, or an equivalent combination of moderate- and vigorous-intensity aerobic activity. Aerobic activity should be performed in episodes of at least 10 minutes, and preferably, it should be spread throughout the week.  Behavioral modification strategies: increasing lean protein intake, decreasing simple carbohydrates , no meal skipping, meal planning , and ways to avoid night time snacking.  Erika Flores has agreed to follow-up with our clinic in 3 weeks.      Objective:   VITALS: Per patient if applicable, see vitals. GENERAL: Alert and in no acute distress. CARDIOPULMONARY: No increased WOB. Speaking in clear sentences.  PSYCH: Pleasant and cooperative. Speech normal rate and rhythm. Affect is appropriate. Insight and judgement are appropriate. Attention is focused, linear, and appropriate.  NEURO: Oriented as arrived to appointment on time with no prompting.   Attestation Statements:   This was prepared with the assistance of Engineer, civil (consulting).  Occasional wrong-word or sound-a-like substitutions may have occurred due to the inherent limitations of voice recognition software.  Corinna Capra, DO

## 2023-01-25 ENCOUNTER — Ambulatory Visit (INDEPENDENT_AMBULATORY_CARE_PROVIDER_SITE_OTHER): Payer: No Typology Code available for payment source | Admitting: Nurse Practitioner

## 2023-01-25 ENCOUNTER — Encounter: Payer: Self-pay | Admitting: Nurse Practitioner

## 2023-01-25 VITALS — BP 129/88 | HR 79 | Temp 97.7°F | Ht 64.0 in | Wt 156.0 lb

## 2023-01-25 DIAGNOSIS — E669 Obesity, unspecified: Secondary | ICD-10-CM

## 2023-01-25 DIAGNOSIS — Z6826 Body mass index (BMI) 26.0-26.9, adult: Secondary | ICD-10-CM | POA: Diagnosis not present

## 2023-01-25 DIAGNOSIS — R632 Polyphagia: Secondary | ICD-10-CM | POA: Diagnosis not present

## 2023-01-25 MED ORDER — ZEPBOUND 10 MG/0.5ML ~~LOC~~ SOAJ
10.0000 mg | SUBCUTANEOUS | 0 refills | Status: DC
Start: 2023-01-25 — End: 2023-02-15

## 2023-01-25 NOTE — Progress Notes (Signed)
Office: [phone]  /  Fax: [phone]  WEIGHT SUMMARY AND BIOMETRICS  Weight Lost Since Last Visit: 3lb  Weight Gained Since Last Visit: 0lb   Vitals Temp: 97.7 F (36.5 C) BP: 129/88 Pulse Rate: 79 SpO2: 100 %   Anthropometric Measurements Height: 5\' 4"  (1.626 m) Weight: 156 lb (70.8 kg) BMI (Calculated): 26.76 Weight at Last Visit: 159lb Weight Lost Since Last Visit: 3lb Weight Gained Since Last Visit: 0lb Starting Weight: 176lb Total Weight Loss (lbs): 20 lb (9.072 kg)   Body Composition  Body Fat %: 27.4 % Fat Mass (lbs): 42.8 lbs Muscle Mass (lbs): 107.4 lbs Total Body Water (lbs): 73.2 lbs Visceral Fat Rating : 5   Other Clinical Data Fasting: No Labs: No Today's Visit #: 7 Starting Date: 09/29/22     HPI  Chief Complaint: OBESITY  Erika Flores is here to discuss her progress with her obesity treatment plan. She is on the the Category 2 Plan and states she is following her eating plan approximately 30 % of the time. She states she is exercising 60 minutes 5 days per week.   Interval History:  Since last office visit she has lost 3 pounds.  She has been on vacation and got engaged since her last visit.  Feels she is overall doing well.       Pharmacotherapy for weight loss: She is currently taking Zepbound 7.5mg  for medical weight loss.  Denies side effects.    Previous pharmacotherapy for medical weight loss:  none  Bariatric surgery:  Has not had bariatric surgery.    PHYSICAL EXAM:  Blood pressure 129/88, pulse 79, temperature 97.7 F (36.5 C), height 5\' 4"  (1.626 m), weight 156 lb (70.8 kg), SpO2 100%. Body mass index is 26.78 kg/m.  General: She is overweight, cooperative, alert, well developed, and in no acute distress. PSYCH: Has normal mood, affect and thought process.   Extremities: No edema.  Neurologic: No gross sensory or motor deficits. No tremors or fasciculations noted.    DIAGNOSTIC DATA REVIEWED:  BMET    Component  Value Date/Time   NA 139 09/29/2022 1000   K 4.2 09/29/2022 1000   CL 101 09/29/2022 1000   CO2 25 09/29/2022 1000   GLUCOSE 88 09/29/2022 1000   BUN 11 09/29/2022 1000   CREATININE 0.75 09/29/2022 1000   CALCIUM 9.6 09/29/2022 1000   Lab Results  Component Value Date   HGBA1C 5.6 09/29/2022   HGBA1C 5.5 01/20/2021   Lab Results  Component Value Date   INSULIN 14.3 09/29/2022   INSULIN 20.0 01/20/2021   Lab Results  Component Value Date   TSH 0.568 09/29/2022   CBC No results found for: "WBC", "RBC", "HGB", "HCT", "PLT", "MCV", "MCH", "MCHC", "RDW" Iron Studies No results found for: "IRON", "TIBC", "FERRITIN", "IRONPCTSAT" Lipid Panel     Component Value Date/Time   CHOL 156 01/20/2021 0845   TRIG 82 01/20/2021 0845   HDL 54 01/20/2021 0845   LDLCALC 86 01/20/2021 0845   Hepatic Function Panel     Component Value Date/Time   PROT 6.9 09/29/2022 1000   ALBUMIN 4.6 09/29/2022 1000   AST 16 09/29/2022 1000   ALT 16 09/29/2022 1000   ALKPHOS 57 09/29/2022 1000   BILITOT 0.2 09/29/2022 1000      Component Value Date/Time   TSH 0.568 09/29/2022 1000   Nutritional Lab Results  Component Value Date   VD25OH 81.2 09/29/2022   VD25OH 46.4 01/20/2021     ASSESSMENT AND  PLAN  TREATMENT PLAN FOR OBESITY:  Recommended Dietary Goals  Avarie is currently in the action stage of change. As such, her goal is to continue weight management plan. She has agreed to the Category 2 Plan.  Behavioral Intervention  We discussed the following Behavioral Modification Strategies today: increasing lean protein intake, decreasing simple carbohydrates , increasing vegetables, increasing lower glycemic fruits, increasing fiber rich foods, avoiding skipping meals, increasing water intake, keeping healthy foods at home, continue to practice mindfulness when eating, and planning for success.  Additional resources provided today: NA  Recommended Physical Activity Goals  Claudine has  been advised to work up to 150 minutes of moderate intensity aerobic activity a week and strengthening exercises 2-3 times per week for cardiovascular health, weight loss maintenance and preservation of muscle mass.   She has agreed to Continue current level of physical activity    Pharmacotherapy We discussed various medication options to help Gean with her weight loss efforts and we both agreed to increase Zepbound 10mg .  Side effects discussed.  ASSOCIATED CONDITIONS ADDRESSED TODAY  Action/Plan  Polyphagia -     Zepbound; Inject 10 mg into the skin once a week.  Dispense: 2 mL; Refill: 0  Generalized obesity -     Zepbound; Inject 10 mg into the skin once a week.  Dispense: 2 mL; Refill: 0  BMI 26.0-26.9,adult         Return in about 3 weeks (around 02/15/2023).Marland Kitchen She was informed of the importance of frequent follow up visits to maximize her success with intensive lifestyle modifications for her multiple health conditions.   ATTESTASTION STATEMENTS:  Reviewed by clinician on day of visit: allergies, medications, problem list, medical history, surgical history, family history, social history, and previous encounter notes.      Theodis Sato. Lian Pounds FNP-C

## 2023-02-15 ENCOUNTER — Ambulatory Visit (INDEPENDENT_AMBULATORY_CARE_PROVIDER_SITE_OTHER): Payer: No Typology Code available for payment source | Admitting: Bariatrics

## 2023-02-15 ENCOUNTER — Encounter: Payer: Self-pay | Admitting: Bariatrics

## 2023-02-15 VITALS — BP 130/89 | HR 76 | Temp 97.9°F | Ht 64.0 in | Wt 157.0 lb

## 2023-02-15 DIAGNOSIS — E039 Hypothyroidism, unspecified: Secondary | ICD-10-CM | POA: Diagnosis not present

## 2023-02-15 DIAGNOSIS — E669 Obesity, unspecified: Secondary | ICD-10-CM

## 2023-02-15 DIAGNOSIS — Z6826 Body mass index (BMI) 26.0-26.9, adult: Secondary | ICD-10-CM

## 2023-02-15 DIAGNOSIS — R632 Polyphagia: Secondary | ICD-10-CM

## 2023-02-15 DIAGNOSIS — E038 Other specified hypothyroidism: Secondary | ICD-10-CM

## 2023-02-15 MED ORDER — ZEPBOUND 10 MG/0.5ML ~~LOC~~ SOAJ: 10.0000 mg | SUBCUTANEOUS | 0 refills | Status: DC

## 2023-02-15 NOTE — Progress Notes (Signed)
   WEIGHT SUMMARY AND BIOMETRICS  Weight Gained Since Last Visit: 1lb   Vitals Temp: 97.9 F (36.6 C) BP: 130/89 Pulse Rate: 76 SpO2: 99 %   Anthropometric Measurements Height: 5\' 4"  (1.626 m) Weight: 157 lb (71.2 kg) BMI (Calculated): 26.94 Weight at Last Visit: 156lb Weight Gained Since Last Visit: 1lb Starting Weight: 176lb Total Weight Loss (lbs): 19 lb (8.618 kg)   Body Composition  Body Fat %: 27.7 % Fat Mass (lbs): 43.6 lbs Muscle Mass (lbs): 107.8 lbs Total Body Water (lbs): 74 lbs Visceral Fat Rating : 5   Other Clinical Data Fasting: yes Labs: no Today's Visit #: 8 Starting Date: 09/29/22    OBESITY Karyssa is here to discuss her progress with her obesity treatment plan along with follow-up of her obesity related diagnoses.     Nutrition Plan: the Category 2 plan - 50% adherence.  Current exercise: none  Interim History:  She is up 1 lb since her last visit. She has been exercising less.  Protein intake is as prescribed and Water intake is adequate.  Pharmacotherapy: Tambi is on Zepbound 10 mg SQ weekly Adverse side effects: None Hunger is moderately controlled.  Cravings are moderately controlled.  Assessment/Plan:   1. Polyphagia Polyphagia Anasha endorses excessive hunger.  Medication(s): Zepbound Effects of medication:  moderately controlled. Cravings are moderately controlled.   Plan: Medication(s): Zepbound 10 mg SQ weekly Will increase water, protein and fiber to help assuage hunger.  Will minimize foods that have a high glucose index/load to minimize reactive hypoglycemia.   Hypothyroidism Stable.  Does not report symptoms associated with uncontrolled hypothyroidism. Medication(s): Levothyroxine 150 mcg daily. Lab Results  Component Value Date   TSH 0.568 09/29/2022    Plan: Continue levothyroxine at current dose. Counseling: The correct way to take levothyroxine is fasting, with water, separated by at least 30  minutes from breakfast, and separated by more than 4 hours from calcium, iron, multivitamins, acid reflux medications (PPIs).    Generalized Obesity: Current BMI BMI (Calculated): 26.94   Pharmacotherapy Plan Continue and refill  Zepbound 10 mg SQ weekly  Valarie is currently in the action stage of change. As such, her goal is to continue with weight loss efforts.  She has agreed to the Category 2 plan.  Exercise goals: All adults should avoid inactivity. Some physical activity is better than none, and adults who participate in any amount of physical activity gain some health benefits.  Behavioral modification strategies: increasing lean protein intake, decreasing simple carbohydrates , no meal skipping, increase water intake, and better snacking choices.  Marie has agreed to follow-up with our clinic in 4 weeks.      Objective:   VITALS: Per patient if applicable, see vitals. GENERAL: Alert and in no acute distress. CARDIOPULMONARY: No increased WOB. Speaking in clear sentences.  PSYCH: Pleasant and cooperative. Speech normal rate and rhythm. Affect is appropriate. Insight and judgement are appropriate. Attention is focused, linear, and appropriate.  NEURO: Oriented as arrived to appointment on time with no prompting.   Attestation Statements:    This was prepared with the assistance of Engineer, civil (consulting).  Occasional wrong-word or sound-a-like substitutions may have occurred due to the inherent limitations of voice recognition software.   Corinna Capra, DO

## 2023-03-28 ENCOUNTER — Ambulatory Visit (INDEPENDENT_AMBULATORY_CARE_PROVIDER_SITE_OTHER): Payer: Medicare Other | Admitting: Bariatrics

## 2023-03-28 ENCOUNTER — Encounter: Payer: Self-pay | Admitting: Bariatrics

## 2023-03-28 VITALS — BP 143/102 | HR 76 | Temp 97.9°F | Ht 64.0 in | Wt 152.0 lb

## 2023-03-28 DIAGNOSIS — R030 Elevated blood-pressure reading, without diagnosis of hypertension: Secondary | ICD-10-CM

## 2023-03-28 DIAGNOSIS — E669 Obesity, unspecified: Secondary | ICD-10-CM

## 2023-03-28 DIAGNOSIS — R632 Polyphagia: Secondary | ICD-10-CM | POA: Diagnosis not present

## 2023-03-28 DIAGNOSIS — F5081 Binge eating disorder, mild: Secondary | ICD-10-CM | POA: Diagnosis not present

## 2023-03-28 DIAGNOSIS — E66811 Obesity, class 1: Secondary | ICD-10-CM

## 2023-03-28 DIAGNOSIS — Z6826 Body mass index (BMI) 26.0-26.9, adult: Secondary | ICD-10-CM

## 2023-03-28 MED ORDER — ZEPBOUND 10 MG/0.5ML ~~LOC~~ SOAJ: 10.0000 mg | SUBCUTANEOUS | 0 refills | Status: DC

## 2023-03-28 NOTE — Progress Notes (Signed)
WEIGHT SUMMARY AND BIOMETRICS  Weight Lost Since Last Visit: 5lb  Weight Gained Since Last Visit: 0   Vitals Temp: 97.9 F (36.6 C) BP: (!) 143/102 Pulse Rate: 76 SpO2: 98 %   Anthropometric Measurements Height: 5\' 4"  (1.626 m) Weight: 152 lb (68.9 kg) BMI (Calculated): 26.08 Weight at Last Visit: 157lb Weight Lost Since Last Visit: 5lb Weight Gained Since Last Visit: 0 Starting Weight: 176lb Total Weight Loss (lbs): 24 lb (10.9 kg)   Body Composition  Body Fat %: 26.4 % Fat Mass (lbs): 40.2 lbs Muscle Mass (lbs): 106.8 lbs Total Body Water (lbs): 73.2 lbs Visceral Fat Rating : 5   Other Clinical Data Fasting: yes Labs: no Today's Visit #: 9 Starting Date: 09/29/22    OBESITY Erika Flores is here to discuss her progress with her obesity treatment plan along with follow-up of her obesity related diagnoses.    Nutrition Plan: the Category 2 plan - 50% adherence.  Current exercise: none  Interim History:  She is down another 5 lbs., She states that she occasionally has some cravings at the end of the week before her injection.  Eating all of the food on the plan., Protein intake is as prescribed, Is not skipping meals, and Water intake is adequate.  Pharmacotherapy: Erika Flores is on Zepbound 10 mg SQ weekly Adverse side effects: None Hunger is moderately controlled.  Cravings are moderately controlled.  Assessment/Plan:   1. Polyphagia Polyphagia Erika Flores endorses excessive hunger.  Medication(s): Zxepbound Effects of medication:  well controlled. Cravings are moderately controlled.   Plan: Medication(s): Zepbound 10 mg SQ weekly Will increase water, protein and fiber to help assuage hunger.  Will minimize foods that have a high glucose index/load to minimize reactive hypoglycemia.   2. Eating disorder/emotional eating Erika Flores has had issues with stress eating, emotional eating, and binge eating. Currently this is moderately controlled. Overall mood is  stable. Denies suicidal/homicidal ideation. Medication(s): Vyvanse, and Zepbound  Plan:  Motivational interviewing as well as evidence-based interventions for health behavior change were utilized today including the discussion of self monitoring techniques, problem-solving barriers and SMART goal setting techniques.  Rx: Zepbound 10 mg weekly, SQ 1 month supply with 0 refills.  Discussed distractions to curb eating behaviors. Be sure to get adequate rest as lack of rest can trigger appetite.  Have plan in place for stressful events.  Consider other rewards besides food.    3. Elevated blood pressure:   Her blood pressure was elevated today. Her last reading here was normal.   Plan: We repeated her blood pressure X 2. Will monitor over time. If no resolution will refer to PCP.    Generalized Obesity: Current BMI BMI (Calculated): 26.08   Pharmacotherapy Plan Continue and refill  Zepbound 10 mg SQ weekly  Erika Flores is currently in the action stage of change. As such, her goal is to continue with weight loss efforts.  She has agreed to the Category 2 plan.  Exercise goals: For additional and more extensive health benefits, adults should increase their aerobic physical activity to 300 minutes (5 hours) a week of moderate-intensity, or 150 minutes a week of vigorous-intensity aerobic physical activity, or an equivalent combination of moderate- and vigorous-intensity activity. Additional health benefits are gained by engaging in physical activity beyond this amount.   Behavioral modification strategies: increasing lean protein intake, decreasing simple carbohydrates , no meal skipping, meal planning , increase water intake, better snacking choices, planning for success, and ways to avoid night time snacking.  Erika Flores has agreed to follow-up with our clinic in 4 weeks.       Objective:   VITALS: Per patient if applicable, see vitals. GENERAL: Alert and in no acute distress. CARDIOPULMONARY: No  increased WOB. Speaking in clear sentences.  PSYCH: Pleasant and cooperative. Speech normal rate and rhythm. Affect is appropriate. Insight and judgement are appropriate. Attention is focused, linear, and appropriate.  NEURO: Oriented as arrived to appointment on time with no prompting.   Attestation Statements:   This was prepared with the assistance of Engineer, civil (consulting).  Occasional wrong-word or sound-a-like substitutions may have occurred due to the inherent limitations of voice recognition software.   Corinna Capra, DO

## 2023-04-11 ENCOUNTER — Encounter: Payer: Self-pay | Admitting: Nurse Practitioner

## 2023-04-28 ENCOUNTER — Encounter: Payer: Self-pay | Admitting: Bariatrics

## 2023-04-28 ENCOUNTER — Ambulatory Visit: Payer: Medicare Other | Admitting: Bariatrics

## 2023-04-28 VITALS — BP 123/88 | HR 84 | Temp 97.6°F | Ht 64.0 in | Wt 151.0 lb

## 2023-04-28 DIAGNOSIS — E66811 Obesity, class 1: Secondary | ICD-10-CM

## 2023-04-28 DIAGNOSIS — R7303 Prediabetes: Secondary | ICD-10-CM | POA: Diagnosis not present

## 2023-04-28 DIAGNOSIS — Z6825 Body mass index (BMI) 25.0-25.9, adult: Secondary | ICD-10-CM

## 2023-04-28 DIAGNOSIS — R632 Polyphagia: Secondary | ICD-10-CM

## 2023-04-28 DIAGNOSIS — E669 Obesity, unspecified: Secondary | ICD-10-CM

## 2023-04-28 MED ORDER — ZEPBOUND 10 MG/0.5ML ~~LOC~~ SOAJ: 10.0000 mg | SUBCUTANEOUS | 0 refills | Status: DC

## 2023-04-28 MED ORDER — ZEPBOUND 12.5 MG/0.5ML ~~LOC~~ SOAJ: 12.5000 mg | SUBCUTANEOUS | 0 refills | Status: DC

## 2023-04-28 NOTE — Progress Notes (Signed)
WEIGHT SUMMARY AND BIOMETRICS  Weight Lost Since Last Visit: 1lb  Weight Gained Since Last Visit: 0   Vitals Temp: 97.6 F (36.4 C) BP: 123/88 Pulse Rate: 84 SpO2: 99 %   Anthropometric Measurements Height: 5\' 4"  (1.626 m) Weight: 151 lb (68.5 kg) BMI (Calculated): 25.91 Weight at Last Visit: 152lb Weight Lost Since Last Visit: 1lb Weight Gained Since Last Visit: 0 Starting Weight: 176lb Total Weight Loss (lbs): 25 lb (11.3 kg)   Body Composition  Body Fat %: 25.2 % Fat Mass (lbs): 38.2 lbs Muscle Mass (lbs): 107.8 lbs Total Body Water (lbs): 72 lbs Visceral Fat Rating : 4   Other Clinical Data Fasting: yes Labs: no Today's Visit #: 10 Starting Date: 09/29/22    OBESITY Erika Flores is here to discuss her progress with her obesity treatment plan along with follow-up of her obesity related diagnoses.    Nutrition Plan: the Category 2 plan - 50% adherence.  Current exercise: walking  Interim History:  She is down 1 lb. She has been traveling and eating out some since her last visit. She has also been waking up at night hungry and eating. She has tried to eat protein when this happens.  Eating all of the food on the plan., Protein intake is as prescribed, and Is not skipping meals   Pharmacotherapy: Erika Flores is on Zepbound 10 mg SQ weekly Adverse side effects: None Hunger is moderately controlled.  Cravings are moderately controlled.  Assessment/Plan:   1. Polyphagia Polyphagia Erika Flores endorses excessive hunger.  Medication(s): Zepbound Effects of medication:  poorly controlled. Cravings are moderately controlled.   Plan: Medication(s): Zepbound 12.5 mg SQ weekly Will increase water, protein and fiber to help assuage hunger.  Will minimize foods that have a high glucose index/load to minimize reactive hypoglycemia.   Prediabetes Last A1c was 5.6   Medication(s): none, is taking Zepbound for weight loss.  Lab Results  Component Value Date   HGBA1C 5.6 09/29/2022   HGBA1C 5.5 01/20/2021   Lab Results  Component Value Date   INSULIN 14.3 09/29/2022   INSULIN 20.0 01/20/2021    Plan: Will minimize all refined carbohydrates both sweets and starches.  Will work on the plan and exercise.  Consider both aerobic and resistance training.  Will keep protein, water, and fiber intake high.  Aim for 7 to 9 hours of sleep nightly.  Will continue medications.  Continue and increase dose Zepbound 12.5 mg SQ weekly  Discussed night-time eating and strategies.     Generalized Obesity: Current BMI BMI (Calculated): 25.91   Pharmacotherapy Plan Continue and increase dose  Zepbound 12.5 mg SQ weekly  Erika Flores is currently in the action stage of change. As such, her goal is to continue with weight loss efforts.  She has agreed to the Category 2 plan.  Exercise goals: For additional and more extensive health benefits, adults  should increase their aerobic physical activity to 300 minutes (5 hours) a week of moderate-intensity, or 150 minutes a week of vigorous-intensity aerobic physical activity, or an equivalent combination of moderate- and vigorous-intensity activity. Additional health benefits are gained by engaging in physical activity beyond this amount.   Behavioral modification strategies: increasing lean protein intake, no meal skipping, increase water intake, better snacking choices, planning for success, increasing vegetables, emotional eating strategies, get rid of junk food in the home, decrease snacking , and avoiding temptations.  Erika Flores has agreed to follow-up with our clinic in 4 weeks.     Objective:   VITALS: Per patient if applicable, see vitals. GENERAL: Alert and in no acute distress. CARDIOPULMONARY: No increased WOB. Speaking in clear sentences.  PSYCH: Pleasant and cooperative. Speech normal rate and rhythm. Affect is  appropriate. Insight and judgement are appropriate. Attention is focused, linear, and appropriate.  NEURO: Oriented as arrived to appointment on time with no prompting.   Attestation Statements:   This was prepared with the assistance of Engineer, civil (consulting).  Occasional wrong-word or sound-a-like substitutions may have occurred due to the inherent limitations of voice recognition   Corinna Capra, DO

## 2023-05-31 ENCOUNTER — Encounter: Payer: Self-pay | Admitting: Bariatrics

## 2023-05-31 ENCOUNTER — Ambulatory Visit (INDEPENDENT_AMBULATORY_CARE_PROVIDER_SITE_OTHER): Payer: Medicare Other | Admitting: Bariatrics

## 2023-05-31 VITALS — BP 123/84 | HR 89 | Temp 97.5°F | Ht 64.0 in | Wt 145.0 lb

## 2023-05-31 DIAGNOSIS — R632 Polyphagia: Secondary | ICD-10-CM

## 2023-05-31 DIAGNOSIS — E669 Obesity, unspecified: Secondary | ICD-10-CM | POA: Diagnosis not present

## 2023-05-31 DIAGNOSIS — Z6824 Body mass index (BMI) 24.0-24.9, adult: Secondary | ICD-10-CM | POA: Diagnosis not present

## 2023-05-31 DIAGNOSIS — F5081 Binge eating disorder, mild: Secondary | ICD-10-CM | POA: Diagnosis not present

## 2023-05-31 MED ORDER — ZEPBOUND 12.5 MG/0.5ML ~~LOC~~ SOAJ: 12.5000 mg | SUBCUTANEOUS | 0 refills | Status: DC

## 2023-05-31 NOTE — Progress Notes (Signed)
WEIGHT SUMMARY AND BIOMETRICS  Weight Lost Since Last Visit: 6lb  Weight Gained Since Last Visit: 0   Vitals Temp: (!) 97.5 F (36.4 C) BP: 123/84 Pulse Rate: 89 SpO2: 100 %   Anthropometric Measurements Height: 5\' 4"  (1.626 m) Weight: 145 lb (65.8 kg) BMI (Calculated): 24.88 Weight at Last Visit: 151lb Weight Lost Since Last Visit: 6lb Weight Gained Since Last Visit: 0 Starting Weight: 176lb Total Weight Loss (lbs): 31 lb (14.1 kg)   Body Composition  Body Fat %: 23.1 % Fat Mass (lbs): 33.6 lbs Muscle Mass (lbs): 106 lbs Total Body Water (lbs): 70 lbs Visceral Fat Rating : 4   Other Clinical Data Fasting: yes Labs: no Today's Visit #: 11 Starting Date: 09/29/22    OBESITY Erika Flores is here to discuss her progress with her obesity treatment plan along with follow-up of her obesity related diagnoses.    Nutrition Plan: the Category 2 plan - 65% adherence.  Current exercise: walking and gym  Interim History:  Her weight is down 6 lbs since her last visit.  Eating all of the food on the plan., Protein intake is as prescribed, Is skipping meals, and Water intake is adequate.   Pharmacotherapy: Erika Flores is on Zepbound 12.5 mg SQ weekly Adverse side effects: None Hunger is moderately controlled.  Cravings are moderately controlled.   Assessment/Plan:   Erika Flores endorses excessive hunger.  Medication(s): Zepbound Effects of medication:  moderately controlled. Cravings are well controlled.   Plan: Medication(s): Zepbound 12.5 mg SQ weekly Will increase water, protein and fiber to help assuage hunger.  Will minimize foods that have a high glucose index/load to minimize reactive hypoglycemia.   Eating disorder/emotional eating Erika Flores has had issues with stress eating, emotional eating, and binge eating. Currently this is well controlled.  Overall mood is stable. Denies suicidal/homicidal ideation. Medication(s): Vyvanse 60 mg daily per another provider.   Plan:  Specifically regarding patient's less desirable eating habits and patterns, we employed the technique of small changes when she cannot fully commit to her prudent nutritional plan. Discussed distractions to curb eating behaviors. Discussed activities to do with one's hands in the evening  Be sure to get adequate rest as lack of rest can trigger appetite.  Have plan in place for stressful events.  Consider other rewards besides food.     Generalized Obesity: Current BMI BMI (Calculated): 24.88   Pharmacotherapy Plan Continue and refill  Zepbound 12.5 mg SQ weekly  Erika Flores is currently in the action stage of change. As such, her goal is to continue with weight loss efforts.  She has agreed to the Category 2 plan.  Exercise goals: For substantial health benefits, adults should do at least 150 minutes (2 hours and 30 minutes) a week of moderate-intensity, or 75 minutes (1 hour and 15 minutes) a week of vigorous-intensity aerobic physical activity, or an equivalent  combination of moderate- and vigorous-intensity aerobic activity. Aerobic activity should be performed in episodes of at least 10 minutes, and preferably, it should be spread throughout the week.  Behavioral modification strategies: increasing lean protein intake, decreasing simple carbohydrates , no meal skipping, meal planning , increase water intake, emotional eating strategies, get rid of junk food in the home, avoiding temptations, keep healthy foods in the home, increase frequency of journaling, and mindful eating.  Ayva has agreed to follow-up with our clinic in 4 weeks.      Objective:   VITALS: Per patient if applicable, see vitals. GENERAL: Alert and in no acute distress. CARDIOPULMONARY: No increased WOB. Speaking in clear sentences.  PSYCH: Pleasant and cooperative. Speech normal rate and rhythm.  Affect is appropriate. Insight and judgement are appropriate. Attention is focused, linear, and appropriate.  NEURO: Oriented as arrived to appointment on time with no prompting.   Attestation Statements:    This was prepared with the assistance of Engineer, civil (consulting).  Occasional wrong-word or sound-a-like substitutions may have occurred due to the inherent limitations of voice recognition   Erika Capra, DO

## 2023-06-27 ENCOUNTER — Other Ambulatory Visit: Payer: Self-pay

## 2023-06-28 ENCOUNTER — Ambulatory Visit (INDEPENDENT_AMBULATORY_CARE_PROVIDER_SITE_OTHER): Payer: Medicare Other | Admitting: Bariatrics

## 2023-06-28 ENCOUNTER — Encounter: Payer: Self-pay | Admitting: Bariatrics

## 2023-06-28 VITALS — BP 138/90 | HR 92 | Temp 97.4°F | Ht 64.0 in | Wt 140.0 lb

## 2023-06-28 DIAGNOSIS — E669 Obesity, unspecified: Secondary | ICD-10-CM

## 2023-06-28 DIAGNOSIS — R632 Polyphagia: Secondary | ICD-10-CM | POA: Diagnosis not present

## 2023-06-28 DIAGNOSIS — Z6824 Body mass index (BMI) 24.0-24.9, adult: Secondary | ICD-10-CM | POA: Diagnosis not present

## 2023-06-28 DIAGNOSIS — R030 Elevated blood-pressure reading, without diagnosis of hypertension: Secondary | ICD-10-CM

## 2023-06-28 MED ORDER — ZEPBOUND 12.5 MG/0.5ML ~~LOC~~ SOAJ: 12.5000 mg | SUBCUTANEOUS | 0 refills | Status: AC

## 2023-06-28 NOTE — Progress Notes (Signed)
 WEIGHT SUMMARY AND BIOMETRICS  Weight Lost Since Last Visit: 5lb  Weight Gained Since Last Visit: 0   Vitals Temp: (!) 97.4 F (36.3 C) BP: (!) 138/90 Pulse Rate: 92 SpO2: 98 %   Anthropometric Measurements Height: 5' 4 (1.626 m) Weight: 140 lb (63.5 kg) BMI (Calculated): 24.02 Weight at Last Visit: 145lb Weight Lost Since Last Visit: 5lb Weight Gained Since Last Visit: 0 Starting Weight: 176lb Total Weight Loss (lbs): 36 lb (16.3 kg)   Body Composition  Body Fat %: 22.7 % Fat Mass (lbs): 31.8 lbs Muscle Mass (lbs): 102.8 lbs Total Body Water (lbs): 69 lbs Visceral Fat Rating : 4   Other Clinical Data Fasting: yes Labs: no Today's Visit #: 12 Starting Date: 09/29/22    OBESITY Leydi is here to discuss her progress with her obesity treatment plan along with follow-up of her obesity related diagnoses.    Nutrition Plan: the Category 2 plan - 50% adherence.  Current exercise: walking and going to the gym.  Interim History:  She is down another 5 lbs since her last visit.  Eating all of the food on the plan., Protein intake is as prescribed, Is not skipping meals, Water intake is adequate., and Denies excessive cravings.   Pharmacotherapy: Lakyn is on Zepbound  12.5 mg SQ weekly Adverse side effects: None Hunger is moderately controlled.  Cravings are moderately controlled.  Assessment/Plan:   Jaylanie Boschee endorses excessive hunger.  Medication(s): epbound Effects of medication:  moderately controlled. Cravings are moderately controlled.   Plan: Medication(s): Zepbound  12.5 mg SQ weekly Will increase water, protein and fiber to help assuage hunger.  Will minimize foods that have a high glucose index/load to minimize reactive hypoglycemia.   Elevated blood pressure without diagnosis of hypertension Blood pressure is noted to be  borderline elevated today, but normal in the past. Marylan denies chest pains or SOB. BP Readings from Last 3 Encounters:  06/28/23 (!) 138/90  05/31/23 123/84  04/28/23 123/88    Plan: Continue to monitor.  If blood pressure continues to be elevated at future office visits, will consider starting antihypertensive medication. Patient will recheck BP at home in the AM 3 times a week and will bring in readings.  No added salt.      Generalized Obesity: Current BMI BMI (Calculated): 24.02   Pharmacotherapy Plan Continue and refill  Zepbound  12.5 mg SQ weekly  Nur is currently in the action stage of change. As such, her goal is to continue with weight loss efforts.  She has agreed to the Category 2 plan.  Exercise goals: For substantial health benefits, adults should do at least 150 minutes (2 hours and 30 minutes) a week of moderate-intensity, or 75 minutes (1 hour and 15 minutes) a week of vigorous-intensity aerobic physical activity, or an equivalent combination of moderate- and vigorous-intensity aerobic activity. Aerobic activity should be performed  in episodes of at least 10 minutes, and preferably, it should be spread throughout the week.  Behavioral modification strategies: increasing lean protein intake, decreasing simple carbohydrates , no meal skipping, increase water intake, better snacking choices, planning for success, avoiding temptations, keep healthy foods in the home, weigh protein portions, and mindful eating.  Jerlisa has agreed to follow-up with our clinic in 4 weeks.      Objective:   VITALS: Per patient if applicable, see vitals. GENERAL: Alert and in no acute distress. CARDIOPULMONARY: No increased WOB. Speaking in clear sentences.  PSYCH: Pleasant and cooperative. Speech normal rate and rhythm. Affect is appropriate. Insight and judgement are appropriate. Attention is focused, linear, and appropriate.  NEURO: Oriented as arrived to appointment on time with no  prompting.   Attestation Statements:    This was prepared with the assistance of Engineer, Civil (consulting).  Occasional wrong-word or sound-a-like substitutions may have occurred due to the inherent limitations of voice recognition    Clayborne Daring, DO

## 2023-08-02 ENCOUNTER — Ambulatory Visit: Payer: Medicare Other | Admitting: Bariatrics
